# Patient Record
Sex: Female | Born: 1980 | Race: White | Hispanic: No | Marital: Married | Smoking: Never smoker
Health system: Southern US, Community
[De-identification: ages and names within clinical notes are randomized; demographics above are authoritative.]

## PROBLEM LIST (undated history)

## (undated) DIAGNOSIS — Z87442 Personal history of urinary calculi: Secondary | ICD-10-CM

## (undated) DIAGNOSIS — B019 Varicella without complication: Secondary | ICD-10-CM

## (undated) DIAGNOSIS — J302 Other seasonal allergic rhinitis: Secondary | ICD-10-CM

## (undated) DIAGNOSIS — J189 Pneumonia, unspecified organism: Secondary | ICD-10-CM

## (undated) DIAGNOSIS — D242 Benign neoplasm of left breast: Secondary | ICD-10-CM

## (undated) DIAGNOSIS — M1990 Unspecified osteoarthritis, unspecified site: Secondary | ICD-10-CM

## (undated) HISTORY — DX: Varicella without complication: B01.9

## (undated) HISTORY — PX: BREAST EXCISIONAL BIOPSY: SUR124

## (undated) HISTORY — PX: DILATION AND CURETTAGE OF UTERUS: SHX78

## (undated) HISTORY — DX: Other seasonal allergic rhinitis: J30.2

---

## 1998-03-27 HISTORY — PX: WISDOM TOOTH EXTRACTION: SHX21

## 2001-03-27 HISTORY — PX: BREAST BIOPSY: SHX20

## 2010-01-11 ENCOUNTER — Other Ambulatory Visit: Payer: Self-pay | Admitting: Physician Assistant

## 2014-03-27 ENCOUNTER — Emergency Department: Payer: Self-pay | Admitting: Emergency Medicine

## 2014-04-06 ENCOUNTER — Emergency Department: Payer: Self-pay | Admitting: Emergency Medicine

## 2014-11-25 ENCOUNTER — Encounter: Payer: Self-pay | Admitting: Family Medicine

## 2014-11-25 ENCOUNTER — Ambulatory Visit (INDEPENDENT_AMBULATORY_CARE_PROVIDER_SITE_OTHER)
Admission: RE | Admit: 2014-11-25 | Discharge: 2014-11-25 | Disposition: A | Discharge status: Home / Self Care | Payer: 59 | Source: Ambulatory Visit | Attending: Family Medicine | Admitting: Family Medicine

## 2014-11-25 ENCOUNTER — Other Ambulatory Visit (HOSPITAL_COMMUNITY)
Admission: RE | Admit: 2014-11-25 | Discharge: 2014-11-25 | Disposition: A | Discharge status: Home / Self Care | Payer: 59 | Source: Ambulatory Visit | Attending: Family Medicine | Admitting: Family Medicine

## 2014-11-25 ENCOUNTER — Encounter (INDEPENDENT_AMBULATORY_CARE_PROVIDER_SITE_OTHER): Payer: Self-pay

## 2014-11-25 ENCOUNTER — Ambulatory Visit (INDEPENDENT_AMBULATORY_CARE_PROVIDER_SITE_OTHER): Payer: 59 | Admitting: Family Medicine

## 2014-11-25 VITALS — BP 102/70 | HR 88 | Temp 99.2°F | Ht 66.25 in | Wt 153.1 lb

## 2014-11-25 DIAGNOSIS — Z3169 Encounter for other general counseling and advice on procreation: Secondary | ICD-10-CM | POA: Insufficient documentation

## 2014-11-25 DIAGNOSIS — J189 Pneumonia, unspecified organism: Secondary | ICD-10-CM | POA: Diagnosis not present

## 2014-11-25 DIAGNOSIS — R059 Cough, unspecified: Secondary | ICD-10-CM

## 2014-11-25 DIAGNOSIS — R05 Cough: Secondary | ICD-10-CM

## 2014-11-25 DIAGNOSIS — Z124 Encounter for screening for malignant neoplasm of cervix: Secondary | ICD-10-CM | POA: Diagnosis not present

## 2014-11-25 DIAGNOSIS — N898 Other specified noninflammatory disorders of vagina: Secondary | ICD-10-CM | POA: Diagnosis not present

## 2014-11-25 DIAGNOSIS — Z01419 Encounter for gynecological examination (general) (routine) without abnormal findings: Secondary | ICD-10-CM | POA: Insufficient documentation

## 2014-11-25 DIAGNOSIS — Z0000 Encounter for general adult medical examination without abnormal findings: Secondary | ICD-10-CM

## 2014-11-25 DIAGNOSIS — Z113 Encounter for screening for infections with a predominantly sexual mode of transmission: Secondary | ICD-10-CM | POA: Insufficient documentation

## 2014-11-25 LAB — BORDETELLA, RAPID TEST: BORDETELLA, RAPID: NEGATIVE

## 2014-11-25 MED ORDER — FLUCONAZOLE 150 MG PO TABS: ORAL_TABLET | ORAL | Status: DC

## 2014-11-25 MED ORDER — DOXYCYCLINE HYCLATE 100 MG PO TABS: 100.0000 mg | ORAL_TABLET | Freq: Two times a day (BID) | ORAL | Status: DC

## 2014-11-25 MED ORDER — HYDROCODONE-HOMATROPINE 5-1.5 MG/5ML PO SYRP: 5.0000 mL | ORAL_SOLUTION | Freq: Three times a day (TID) | ORAL | Status: DC | PRN

## 2014-11-25 MED ORDER — AZITHROMYCIN 250 MG PO TABS: ORAL_TABLET | ORAL | Status: DC

## 2014-11-25 MED ORDER — BENZONATATE 100 MG PO CAPS: 100.0000 mg | ORAL_CAPSULE | Freq: Three times a day (TID) | ORAL | Status: DC | PRN

## 2014-11-25 NOTE — Assessment & Plan Note (Signed)
Wet prep obtained today. Given symptom at all she will treat empirically for yeast while awaiting wet prep.

## 2014-11-25 NOTE — Progress Notes (Signed)
Pre visit review using our clinic review tool, if applicable. No additional management support is needed unless otherwise documented below in the visit note. 

## 2014-11-25 NOTE — Progress Notes (Signed)
Subjective:  Patient ID: Jodi Wright, female    DOB: May 30, 1980  Age: 34 y.o. MRN: [id number]  CC: Cough/Respiratory infection  HPI Jodi Wright is a 34 y.o. female presents to the clinic today with complaints of cough, congestion, and persistent respiratory tract infection.  Patient is also had recent vaginal discharge.  1) Cough, Congestion, Recently diagnosed with respiratory infection  Patient reports she has been sick since the beginning of August.  She initially developed congestion and associated URI symptoms. She was evaluated a local urgent care and told she had a viral upper respiratory infection and was given an antibiotic to take if she failed to improve. She states she took a full course of Augmentin.  Despite antibiotics she's continued to have persistent symptoms: Nasal congestion, cough, associated chest discomfort, drainage, fever.  She has had little improvement with over-the-counter Mucinex.  No exacerbating factors.  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  She states that she continues to feel poorly and is concerned that her respiratory infection is worsening.  2) Vaginal discharge  Patient reports that this past Saturday she developed vaginal discharge and associated itching.  She reports that she's had some blood when she wipes the discharge.  She denies any associated odor.  She used over-the-counter Monistat given her symptoms and has had minimal improvement.  She states that she is married and has no concerns about STDs.  No reports of dysuria, urinary urgency, frequency.  No other associative symptoms noted.  PMH, Surgical Hx, Family Hx, Social History reviewed and updated as below. Past Medical History  Diagnosis Date  . Chicken pox     Past Surgical History  Procedure Laterality Date  . Breast biopsy  2003    Family History  Problem Relation Age of Onset  . Arthritis Mother     . Cancer Mother     colon  . Hyperlipidemia Mother   . Mental illness Mother   . Hyperlipidemia Father   . Stroke Father   . Hypertension Father   . Arthritis Maternal Aunt   . Cancer Maternal Aunt     colon  . Arthritis Maternal Grandmother   . Cancer Maternal Grandfather     lung  . Diabetes Paternal Grandmother   . Alcohol abuse Paternal Grandfather     Social History  Substance Use Topics  . Smoking status: Never Smoker   . Smokeless tobacco: Never Used  . Alcohol Use: 0.0 - 0.6 oz/week    0-1 Standard drinks or equivalent per week   Review of Systems  Constitutional: Positive for fever and chills.  HENT: Positive for sinus pressure and sore throat.   Eyes: Negative.   Respiratory: Positive for cough. Negative for shortness of breath.   Gastrointestinal: Positive for nausea. Negative for vomiting.  Genitourinary:       Patient has had some incontinence associated with cough.  Musculoskeletal: Negative.   Skin: Negative.   Neurological: Negative.   Psychiatric/Behavioral: Negative.    Objective:   Today's Vitals: BP 102/70 mmHg  Pulse 88  Temp(Src) 99.2 F (37.3 C) (Oral)  Ht 5' 6.25" (1.683 m)  Wt 153 lb 2 oz (69.457 kg)  BMI 24.52 kg/m2  SpO2 97%  LMP 11/04/2014  Physical Exam  Constitutional: She is oriented to person, place, and time. She appears well-developed and well-nourished. No distress.  HENT:  Head: Normocephalic and atraumatic.  Mouth/Throat: No oropharyngeal exudate.  Eyes: Left eye exhibits no  discharge.  Neck: Neck supple.  Cardiovascular: Normal rate and regular rhythm.   No murmur heard. Pulmonary/Chest: Effort normal and breath sounds normal. No respiratory distress. She has no wheezes. She has no rales.  Abdominal: Soft. She exhibits no distension. There is no tenderness. There is no rebound and no guarding.  Genitourinary:  Pelvic Exam: External: normal female genitalia without lesions or        Masses. Vagina: normal without  lesions or masses Cervix: Yellow/green discharge noted from the cervical Os. Pap smear: performed Wet prep obtained.    Lymphadenopathy:    She has no cervical adenopathy.  Neurological: She is alert and oriented to person, place, and time.  Skin: Skin is warm and dry.  Psychiatric: She has a normal mood and affect.    Assessment & Plan:   Problem List Items Addressed This Visit    CAP (community acquired pneumonia) - Primary    Given persistent symptoms particular cough, chest x-ray as well as rapid pertussis obtained today. Awaiting pertussis test. I reviewed the chest x-ray personally. It revealed findings consistent with early pneumonia of the left lingular lobe. Treating patient with azithromycin. Tessalon and Hycodan given for cough.      Relevant Medications   cetirizine (ZYRTEC) 10 MG tablet   benzonatate (TESSALON) 100 MG capsule   HYDROcodone-homatropine (HYCODAN) 5-1.5 MG/5ML syrup   fluconazole (DIFLUCAN) 150 MG tablet   azithromycin (ZITHROMAX) 250 MG tablet   Preventative health care    Pap smear obtained today.      Vaginal discharge    Wet prep obtained today. Given symptom at all she will treat empirically for yeast while awaiting wet prep.      Relevant Orders   WET PREP FOR [place], YEAST, CLUE    Other Visit Diagnoses    Cough        Relevant Orders    DG Chest 2 View (Completed)    Bordetella, Rapid test    Pap smear for cervical cancer screening        Relevant Orders    Cytology - PAP       Outpatient Encounter Prescriptions as of 11/25/2014  Medication Sig  . cetirizine (ZYRTEC) 10 MG tablet Take 10 mg by mouth daily.  Marland Kitchen ibuprofen (ADVIL,MOTRIN) 100 MG tablet Take 100 mg by mouth every 6 (six) hours as needed for fever.  Marland Kitchen azithromycin (ZITHROMAX) 250 MG tablet Take 2 tablets on Day 1; 1 tablet daily on days 2-5.  . benzonatate (TESSALON) 100 MG capsule Take 1 capsule (100 mg total) by mouth 3 (three) times daily as needed for cough.  .  fluconazole (DIFLUCAN) 150 MG tablet Take 1 tablet once. Repeat dosing in 72 hours.  Marland Kitchen HYDROcodone-homatropine (HYCODAN) 5-1.5 MG/5ML syrup Take 5 mLs by mouth every 8 (eight) hours as needed for cough.  . [DISCONTINUED] doxycycline (VIBRA-TABS) 100 MG tablet Take 1 tablet (100 mg total) by mouth 2 (two) times daily.   No facility-administered encounter medications on file as of 11/25/2014.    Follow-up: PRN.   Coral Spikes DO

## 2014-11-25 NOTE — Assessment & Plan Note (Addendum)
Given persistent symptoms particularly fever & cough, chest x-ray as well as rapid pertussis obtained today. Awaiting pertussis test. I reviewed the chest x-ray personally. It revealed findings consistent with early pneumonia of the left lingular lobe. Treating patient with azithromycin. Tessalon and Hycodan given for cough.

## 2014-11-25 NOTE — Assessment & Plan Note (Signed)
Pap smear obtained today. 

## 2014-11-25 NOTE — Patient Instructions (Addendum)
It was nice to see you today.  Take the antibiotic as prescribed.  Use the hycodan at night and the tessalon during the day (for cough).  Be sure to get your chest xray.  Use the diflucan as directed.  If it persists you can refill it.  We will call with the results of your pap smear.   Follow up annually or sooner if needed.   Take care  Dr. Lacinda Axon

## 2014-11-26 LAB — CYTOLOGY - PAP

## 2014-11-27 ENCOUNTER — Other Ambulatory Visit: Payer: Self-pay | Admitting: Family Medicine

## 2014-11-27 LAB — WET PREP BY MOLECULAR PROBE
Candida species: POSITIVE — AB
GARDNERELLA VAGINALIS: POSITIVE — AB
Trichomonas vaginosis: NEGATIVE

## 2014-11-27 MED ORDER — METRONIDAZOLE 500 MG PO TABS: 500.0000 mg | ORAL_TABLET | Freq: Two times a day (BID) | ORAL | Status: DC

## 2014-12-01 ENCOUNTER — Encounter: Payer: Self-pay | Admitting: Family Medicine

## 2014-12-01 ENCOUNTER — Other Ambulatory Visit: Payer: Self-pay | Admitting: Family Medicine

## 2014-12-01 MED ORDER — ALBUTEROL SULFATE HFA 108 (90 BASE) MCG/ACT IN AERS: 2.0000 | INHALATION_SPRAY | Freq: Four times a day (QID) | RESPIRATORY_TRACT | Status: DC | PRN

## 2014-12-04 ENCOUNTER — Encounter: Payer: Self-pay | Admitting: Family Medicine

## 2014-12-09 ENCOUNTER — Encounter: Payer: Self-pay | Admitting: Family Medicine

## 2014-12-09 ENCOUNTER — Other Ambulatory Visit: Payer: Self-pay | Admitting: Family Medicine

## 2014-12-09 ENCOUNTER — Ambulatory Visit (INDEPENDENT_AMBULATORY_CARE_PROVIDER_SITE_OTHER): Payer: 59 | Admitting: Family Medicine

## 2014-12-09 ENCOUNTER — Ambulatory Visit (INDEPENDENT_AMBULATORY_CARE_PROVIDER_SITE_OTHER)
Admission: RE | Admit: 2014-12-09 | Discharge: 2014-12-09 | Disposition: A | Discharge status: Home / Self Care | Payer: 59 | Source: Ambulatory Visit | Attending: Family Medicine | Admitting: Family Medicine

## 2014-12-09 VITALS — BP 124/76 | HR 92 | Temp 98.8°F | Ht 66.25 in | Wt 156.5 lb

## 2014-12-09 DIAGNOSIS — R059 Cough, unspecified: Secondary | ICD-10-CM

## 2014-12-09 DIAGNOSIS — R05 Cough: Secondary | ICD-10-CM

## 2014-12-09 MED ORDER — CEFTRIAXONE SODIUM 1 G IJ SOLR
1.0000 g | Freq: Once | INTRAMUSCULAR | Status: AC
  Administered 2014-12-09: 1 g via INTRAMUSCULAR

## 2014-12-09 MED ORDER — LIDOCAINE HCL (PF) 1 % IJ SOLN: 2.0000 mL | Freq: Once | INTRAMUSCULAR | Status: DC

## 2014-12-09 MED ORDER — TRAMADOL HCL 50 MG PO TABS: 50.0000 mg | ORAL_TABLET | Freq: Three times a day (TID) | ORAL | Status: DC | PRN

## 2014-12-09 MED ORDER — DOXYCYCLINE HYCLATE 100 MG PO TABS: 100.0000 mg | ORAL_TABLET | Freq: Two times a day (BID) | ORAL | Status: DC

## 2014-12-09 NOTE — Assessment & Plan Note (Signed)
Patient with continued cough and associated chest/rib pain following recent bout of CAP. Repeating chest x-ray today. Empiric treatment with IM Rocephin (given in clinic today). Tramadol for pain. Recommended use of hycodan for cough.

## 2014-12-09 NOTE — Progress Notes (Signed)
Subjective:  Patient ID: Jodi Wright, female    DOB: 1981-02-22  Age: 34 y.o. MRN: [id number]  CC: Cough  HPI:  34 year old female presents to the clinic with complaints of continued cough.   Patient was seen on 8/31. She was diagnosed with ischemic or pneumonia at that time. Pertussis PCR was negative. She was started on azithromycin and given Hycodan for cough.    Patient presents today with complaints of continued cough and associated left upper chest pain/rib pain. She reports her cough remains productive. She states that she has had some improvement but it recently worsened. She states that the pain is severe and unrelenting. Pain interferes with sleep. She denies any associated fever, chills. She does report associated nasal congestion. She's been taking ibuprofen with some relief in her pain. Chest pain exacerbated by cough. She has not taken the hycodan given the fact that she's continued to work and medication has known side effect of drowsiness.  Social Hx   Social History   Social History  . Marital Status: Married    Spouse Name: N/A  . Number of Children: N/A  . Years of Education: N/A   Social History Main Topics  . Smoking status: Never Smoker   . Smokeless tobacco: Never Used  . Alcohol Use: 0.0 - 0.6 oz/week    0-1 Standard drinks or equivalent per week  . Drug Use: No  . Sexual Activity:    Partners: Male   Other Topics Concern  . None   Social History Narrative   Review of Systems  Constitutional: Negative for fever and chills.  HENT: Positive for congestion and sore throat.   Respiratory: Positive for cough and chest tightness.    Objective:  BP 124/76 mmHg  Pulse 92  Temp(Src) 98.8 F (37.1 C) (Oral)  Ht 5' 6.25" (1.683 m)  Wt 156 lb 8 oz (70.988 kg)  BMI 25.06 kg/m2  SpO2 94%  LMP 11/30/2014  BP/Weight 12/09/2014 7/40/8144  Systolic BP 818 563  Diastolic BP 76 70  Wt. (Lbs) 156.5 153.13  BMI 25.06 24.52   Physical Exam    Constitutional: She is oriented to person, place, and time. She appears well-developed and well-nourished.  Appears fatigued.  HENT:  Head: Normocephalic and atraumatic.  Mouth/Throat: No oropharyngeal exudate.  Normal TM's bilaterally.  Cardiovascular: Normal rate and regular rhythm.   No murmur heard. Pulmonary/Chest: Effort normal and breath sounds normal. No respiratory distress.  No appreciable wheezing or rales.  Coughing during exam.  Neurological: She is alert and oriented to person, place, and time.  Psychiatric:  Tearful.   Assessment & Plan:   Problem List Items Addressed This Visit    Cough - Primary    Patient with continued cough and associated chest/rib pain following recent bout of CAP. Repeating chest x-ray today. Empiric treatment with IM Rocephin (given in clinic today). Tramadol for pain. Recommended use of hycodan for cough.       Relevant Medications   lidocaine (PF) (XYLOCAINE) 1 % injection 2 mL (Start on 12/09/2014  2:56 PM)   Other Relevant Orders   DG Chest 2 View      Meds ordered this encounter  Medications  . traMADol (ULTRAM) 50 MG tablet    Sig: Take 1 tablet (50 mg total) by mouth every 8 (eight) hours as needed.    Dispense:  60 tablet    Refill:  0  . lidocaine (PF) (XYLOCAINE) 1 % injection 2 mL  Sig:     Follow-up: PRN    Thersa Salt, DO

## 2014-12-09 NOTE — Patient Instructions (Signed)
Use the Tramadol for pain.  Use the cough syrup as needed.  We will call with your chest xray results.  Take care  Dr. Lacinda Axon

## 2014-12-09 NOTE — Addendum Note (Signed)
Addended by: Carmin Muskrat on: 12/09/2014 02:31 PM   Modules accepted: Orders

## 2014-12-09 NOTE — Progress Notes (Signed)
Pre visit review using our clinic review tool, if applicable. No additional management support is needed unless otherwise documented below in the visit note. 

## 2014-12-30 ENCOUNTER — Encounter: Payer: Self-pay | Admitting: Family Medicine

## 2015-05-05 ENCOUNTER — Ambulatory Visit (INDEPENDENT_AMBULATORY_CARE_PROVIDER_SITE_OTHER): Payer: 59 | Admitting: Family Medicine

## 2015-05-05 ENCOUNTER — Encounter: Payer: Self-pay | Admitting: Family Medicine

## 2015-05-05 VITALS — BP 108/64 | HR 89 | Temp 98.9°F | Ht 66.25 in | Wt 161.0 lb

## 2015-05-05 DIAGNOSIS — F39 Unspecified mood [affective] disorder: Secondary | ICD-10-CM | POA: Diagnosis not present

## 2015-05-05 DIAGNOSIS — Z13220 Encounter for screening for lipoid disorders: Secondary | ICD-10-CM

## 2015-05-05 DIAGNOSIS — R5383 Other fatigue: Secondary | ICD-10-CM | POA: Diagnosis not present

## 2015-05-05 DIAGNOSIS — R4586 Emotional lability: Secondary | ICD-10-CM

## 2015-05-05 LAB — COMPREHENSIVE METABOLIC PANEL
ALBUMIN: 4.3 g/dL (ref 3.5–5.2)
ALK PHOS: 44 U/L (ref 39–117)
ALT: 20 U/L (ref 0–35)
AST: 15 U/L (ref 0–37)
BUN: 13 mg/dL (ref 6–23)
CO2: 28 mEq/L (ref 19–32)
Calcium: 9.6 mg/dL (ref 8.4–10.5)
Chloride: 104 mEq/L (ref 96–112)
Creatinine, Ser: 0.87 mg/dL (ref 0.40–1.20)
GFR: 77.85 mL/min (ref >60.00)
Glucose, Bld: 86 mg/dL (ref 70–99)
POTASSIUM: 3.7 meq/L (ref 3.5–5.1)
SODIUM: 139 meq/L (ref 135–145)
TOTAL PROTEIN: 6.6 g/dL (ref 6.0–8.3)
Total Bilirubin: 0.4 mg/dL (ref 0.2–1.2)

## 2015-05-05 LAB — CBC
HEMATOCRIT: 39.4 % (ref 36.0–46.0)
HEMOGLOBIN: 13.2 g/dL (ref 12.0–15.0)
MCHC: 33.5 g/dL (ref 30.0–36.0)
MCV: 86 fl (ref 78.0–100.0)
Platelets: 239 10*3/uL (ref 150.0–400.0)
RBC: 4.58 Mil/uL (ref 3.87–5.11)
RDW: 13.4 % (ref 11.5–15.5)
WBC: 8.4 10*3/uL (ref 4.0–10.5)

## 2015-05-05 LAB — TSH: TSH: 1.65 u[IU]/mL (ref 0.35–4.50)

## 2015-05-05 LAB — LIPID PANEL
Cholesterol: 186 mg/dL (ref 0–200)
HDL: 56.6 mg/dL (ref >39.00)
LDL Cholesterol: 102 mg/dL — ABNORMAL HIGH (ref 0–99)
NonHDL: 129
Total CHOL/HDL Ratio: 3
Triglycerides: 136 mg/dL (ref 0.0–149.0)
VLDL: 27.2 mg/dL (ref 0.0–40.0)

## 2015-05-05 LAB — VITAMIN B12: VITAMIN B 12: 392 pg/mL (ref 211–911)

## 2015-05-05 LAB — POCT URINE PREGNANCY: Preg Test, Ur: NEGATIVE

## 2015-05-05 NOTE — Patient Instructions (Signed)
We will call with your lab work.  Your exam was normal today.  I'm not sure of the cause of your symptoms.  Follow up if you fail to improve or worsen.  Take care  Dr. Lacinda Axon

## 2015-05-05 NOTE — Progress Notes (Signed)
   Subjective:  Patient ID: Jodi Wright, female    DOB: [birth year]  Age: 35 y.o. MRN: [id number]  CC: Moody, breast tenderness, skin breaking out, weight gain  HPI:  35 year old female presents to clinic today with the above complaints.  Patient states that over the past 1-1.5 months she has not felt like herself. She been expansion fatigue, moodiness, skin breaking out, weight gain. Last menstrual cycle was on January 15. No known inciting factor. No recent stressors at home or at work. She has not changed her diet recently. She does not exercise regularly. She is unclear what is going on. No other associated symptoms. No other complaints. No known exacerbating or relieving factors.  Social Hx   Social History   Social History  . Marital Status: Married    Spouse Name: N/A  . Number of Children: N/A  . Years of Education: N/A   Social History Main Topics  . Smoking status: Never Smoker   . Smokeless tobacco: Never Used  . Alcohol Use: 0.0 - 0.6 oz/week    0-1 Standard drinks or equivalent per week  . Drug Use: No  . Sexual Activity:    Partners: Male   Other Topics Concern  . None   Social History Narrative   Review of Systems  Constitutional: Positive for fatigue.       Weight gain.  Genitourinary:       Breast pain.  Skin:       Facial skin breaking out.  Psychiatric/Behavioral:       Moody/inpatient.   Objective:  BP 108/64 mmHg  Pulse 89  Temp(Src) 98.9 F (37.2 C) (Oral)  Ht 5' 6.25" (1.683 m)  Wt 161 lb (73.029 kg)  BMI 25.78 kg/m2  SpO2 98%  BP/Weight 05/05/2015 12/09/2014 2/77/4128  Systolic BP 786 767 209  Diastolic BP 64 76 70  Wt. (Lbs) 161 156.5 153.13  BMI 25.78 25.06 24.52   Physical Exam  Constitutional: She is oriented to person, place, and time. She appears well-developed. No distress.  Cardiovascular: Normal rate and regular rhythm.   Pulmonary/Chest: Effort normal and breath sounds normal.  Neurological: She is alert and oriented to  person, place, and time.  Psychiatric:  Anxious.   Vitals reviewed. Breasts: breasts appear normal, no appreciable masses, no skin or nipple changes or axillary nodes. Scar noted in the left axillar from prior surgery.   Assessment & Plan:   Problem List Items Addressed This Visit    Other fatigue   Relevant Orders   CBC   Comp Met (CMET)   TSH   B12   Mood changes ([place]) - Primary    New problem. Patient with mood issues/irritability as well as breast tenderness, facial breakouts, fatigue. Patient has was negative today. Symptoms appear to be consistent with PMS or PMDD (although she has not history of this). Reassurance provided. Obtaining labs.      Relevant Orders   POCT urine pregnancy (Completed)    Other Visit Diagnoses    Screening, lipid        Relevant Orders    Lipid Profile      Follow-up: PRN  [place]

## 2015-05-05 NOTE — Assessment & Plan Note (Signed)
New problem. Patient with mood issues/irritability as well as breast tenderness, facial breakouts, fatigue. Patient has was negative today. Symptoms appear to be consistent with PMS or PMDD (although she has not history of this). Reassurance provided. Obtaining labs.

## 2015-09-13 ENCOUNTER — Telehealth: Payer: Self-pay | Admitting: Family Medicine

## 2015-09-13 NOTE — Telephone Encounter (Signed)
[facility]  Patient Name: Jodi Wright  DOB: July 12, 1980    Initial Comment Caller states she has swollen finger, sore and bruised after getting jammed against football   Nurse Assessment  Nurse: Wayne Sever, RN, Tillie Rung Date/Time (Eastern Time): 09/13/2015 11:39:14 AM  Confirm and document reason for call. If symptomatic, describe symptoms. You must click the next button to save text entered. ---Caller states she was playing in the pool yesterday with family and a ball hit the left ring finger. She states the finger is pretty swollen and painful. She states it's swollen at the bottom of the finger. She states it's not hurting as bad today as it was last night.  Has the patient traveled out of the country within the last 30 days? ---Not Applicable  Does the patient have any new or worsening symptoms? ---Yes  Will a triage be completed? ---Yes  Related visit to physician within the last 2 weeks? ---No  Does the PT have any chronic conditions? (i.e. diabetes, asthma, etc.) ---No  Is the patient pregnant or possibly pregnant? (Ask all females between the ages of 27-55) ---No  Is this a behavioral health or substance abuse call? ---No     Guidelines    Guideline Title Affirmed Question Affirmed Notes  Finger Injury Large swelling or bruise    Final Disposition User   See Physician within 24 Hours Ford Cliff, RN, Tillie Rung    Comments  Scheduled at [phone] with Dr Glori Bickers on 06/20. She requested to be scheduled at [place] [facility]   Disagree/Comply: Comply

## 2015-09-14 ENCOUNTER — Ambulatory Visit (INDEPENDENT_AMBULATORY_CARE_PROVIDER_SITE_OTHER)
Admission: RE | Admit: 2015-09-14 | Discharge: 2015-09-14 | Disposition: A | Discharge status: Home / Self Care | Payer: Managed Care, Other (non HMO) | Source: Ambulatory Visit | Attending: Family Medicine | Admitting: Family Medicine

## 2015-09-14 ENCOUNTER — Encounter: Payer: Self-pay | Admitting: Family Medicine

## 2015-09-14 ENCOUNTER — Ambulatory Visit (INDEPENDENT_AMBULATORY_CARE_PROVIDER_SITE_OTHER): Payer: Managed Care, Other (non HMO) | Admitting: Family Medicine

## 2015-09-14 VITALS — BP 102/64 | HR 69 | Temp 99.0°F | Ht 66.25 in | Wt 156.5 lb

## 2015-09-14 DIAGNOSIS — S6992XA Unspecified injury of left wrist, hand and finger(s), initial encounter: Secondary | ICD-10-CM

## 2015-09-14 DIAGNOSIS — S6990XA Unspecified injury of unspecified wrist, hand and finger(s), initial encounter: Secondary | ICD-10-CM | POA: Insufficient documentation

## 2015-09-14 NOTE — Patient Instructions (Signed)
I think you have a contused finger but want to rule out a fracture  Wear the splint to protect it when needed (wrap in guaze and adjust as needed)  Start using ice/ cold compress- 10 minutes at a time whenever you get a chance Xray now - we will get back to you with results I recommend 2 ibuprofen with food three times per day while this heals  If no improvement in a week please let us know

## 2015-09-14 NOTE — Progress Notes (Signed)
Subjective:    Patient ID: Jodi Wright, female    DOB: 07-19-1980, 35 y.o.   MRN: [id number]  HPI Review of Systems    Here with a finger injury (sunday) A ball hit her hand in the pool  (a heavy wet football)  Her ring finger on L hand was hit and bothersome  Both her ring finger and 3rd finger were forcibly flexed with the injury   Bruising started - later that evening  Swelling started within the hour  Had to take her rings off- it was painful   No throbbing  Only hurts to move it )especailly to put fingers together)  A little tender to the touch   Has taken some ibuprofen - just a few times  In the water (the coldness helped) No ice packs   Right now feels a little tight   Did not get a hand splint   ROS: Review of Systems  Constitutional: Negative for fever, appetite change, fatigue and unexpected weight change.  Eyes: Negative for pain and visual disturbance.  Respiratory: Negative for cough and shortness of breath.   Cardiovascular: Negative for cp or palpitations    Gastrointestinal: Negative for nausea, diarrhea and constipation.  Genitourinary: Negative for urgency and frequency.  Skin: Negative for pallor or rash   MSK pos for finer pain  Neurological: Negative for weakness, light-headedness, numbness and headaches.  Hematological: Negative for adenopathy. Does not bruise/bleed easily.  Psychiatric/Behavioral: Negative for dysphoric mood. The patient is not nervous/anxious.       Patient Active Problem List   Diagnosis Date Noted  . Finger injury 09/14/2015  . Mood changes ([place]) 05/05/2015  . Other fatigue 05/05/2015  . Preventative health care 11/25/2014   Past Medical History  Diagnosis Date  . Chicken pox    Past Surgical History  Procedure Laterality Date  . Breast biopsy  2003   Social History  Substance Use Topics  . Smoking status: Never Smoker   . Smokeless tobacco: Never Used  . Alcohol Use: 0.0 - 0.6 oz/week    0-1 Standard  drinks or equivalent per week     Comment: occ   Family History  Problem Relation Age of Onset  . Arthritis Mother   . Cancer Mother     colon  . Hyperlipidemia Mother   . Mental illness Mother   . Hyperlipidemia Father   . Stroke Father   . Hypertension Father   . Arthritis Maternal Aunt   . Cancer Maternal Aunt     colon  . Arthritis Maternal Grandmother   . Cancer Maternal Grandfather     lung  . Diabetes Paternal Grandmother   . Alcohol abuse Paternal Grandfather    Allergies  Allergen Reactions  . Sulfa Antibiotics Nausea And Vomiting  . Ciprofloxacin Rash  . Mucinex Dm [Dm-Guaifenesin Er] Hypertension    Felt dizzy   No current outpatient prescriptions on file prior to visit.   No current facility-administered medications on file prior to visit.     Objective:   Physical Exam  Constitutional: She appears well-developed and well-nourished. No distress.  Well appearing   Eyes: Conjunctivae and EOM are normal. Pupils are equal, round, and reactive to light.  Neck: Normal range of motion. Neck supple.  Cardiovascular: Normal rate and regular rhythm.   Musculoskeletal: She exhibits edema and tenderness.  L hand  4th finger- swollen proximally with ecchymosis on palmar side  Nl rom with pain on full flexion  Nl sens  and perfusion    Lymphadenopathy:    She has no cervical adenopathy.  Neurological: She is alert. She has normal strength and normal reflexes. She displays no atrophy. No sensory deficit. She exhibits normal muscle tone.  Skin: Skin is warm and dry. No erythema.  Psychiatric: She has a normal mood and affect.          Assessment & Plan:   Problem List Items Addressed This Visit      Other   Finger injury - Primary    L ring finger was jammed by a football Is bruised and mildly swollen on palmar side  Nl rom with discomfort Xray today Given baseball splint to protect Recommend ice/ and nsaid prn otc Pending report for further advisement        Relevant Orders   DG Finger Ring Left (Completed)

## 2015-09-14 NOTE — Assessment & Plan Note (Signed)
L ring finger was jammed by a football Is bruised and mildly swollen on palmar side  Nl rom with discomfort Xray today Given baseball splint to protect Recommend ice/ and nsaid prn otc Pending report for further advisement

## 2016-02-01 ENCOUNTER — Other Ambulatory Visit: Payer: Self-pay | Admitting: Family Medicine

## 2016-04-25 ENCOUNTER — Encounter: Payer: Self-pay | Admitting: Family Medicine

## 2016-04-25 ENCOUNTER — Ambulatory Visit (INDEPENDENT_AMBULATORY_CARE_PROVIDER_SITE_OTHER): Payer: Managed Care, Other (non HMO) | Admitting: Family Medicine

## 2016-04-25 DIAGNOSIS — H6690 Otitis media, unspecified, unspecified ear: Secondary | ICD-10-CM | POA: Insufficient documentation

## 2016-04-25 DIAGNOSIS — H65192 Other acute nonsuppurative otitis media, left ear: Secondary | ICD-10-CM | POA: Diagnosis not present

## 2016-04-25 MED ORDER — AMOXICILLIN-POT CLAVULANATE 875-125 MG PO TABS: 1.0000 | ORAL_TABLET | Freq: Two times a day (BID) | ORAL | 0 refills | Status: DC

## 2016-04-25 NOTE — Patient Instructions (Signed)
Take the antibiotic as prescribed.  Ibuprofen 800 mg three times daily as needed.  Take care  Dr. Lacinda Axon    Otitis Media, Adult Otitis media is redness, soreness, and inflammation of the middle ear. Otitis media may be caused by allergies or, most commonly, by infection. Often it occurs as a complication of the common cold. What are the signs or symptoms? Symptoms of otitis media may include:  Earache.  Fever.  Ringing in your ear.  Headache.  Leakage of fluid from the ear. How is this diagnosed? To diagnose otitis media, your health care provider will examine your ear with an otoscope. This is an instrument that allows your health care provider to see into your ear in order to examine your eardrum. Your health care provider also will ask you questions about your symptoms. How is this treated? Typically, otitis media resolves on its own within 3-5 days. Your health care provider may prescribe medicine to ease your symptoms of pain. If otitis media does not resolve within 5 days or is recurrent, your health care provider may prescribe antibiotic medicines if he or she suspects that a bacterial infection is the cause. Follow these instructions at home:  If you were prescribed an antibiotic medicine, finish it all even if you start to feel better.  Take medicines only as directed by your health care provider.  Keep all follow-up visits as directed by your health care provider. Contact a health care provider if:  You have otitis media only in one ear, or bleeding from your nose, or both.  You notice a lump on your neck.  You are not getting better in 3-5 days.  You feel worse instead of better. Get help right away if:  You have pain that is not controlled with medicine.  You have swelling, redness, or pain around your ear or stiffness in your neck.  You notice that part of your face is paralyzed.  You notice that the bone behind your ear (mastoid) is tender when you touch  it. This information is not intended to replace advice given to you by your health care provider. Make sure you discuss any questions you have with your health care provider. Document Released: 12/17/2003 Document Revised: 08/19/2015 Document Reviewed: 10/08/2012 Elsevier Interactive Patient Education  2017 [employer].

## 2016-04-25 NOTE — Progress Notes (Signed)
   Subjective:  Patient ID: Jodi Wright, female    DOB: [birth year]  Age: 36 y.o. MRN: [id number]  CC: Ear pain  HPI:  36 year old female presents with complaints of left ear pain.  Left ear pain  Started yesterday.  Severe.  She's had ongoing sinus congestion.  No associated fever.  She's been using ibuprofen with some improvement.  No known exacerbating factors.  No other associated symptoms. No other complaints at this time.  Social Hx   Social History   Social History  . Marital status: Married    Spouse name: N/A  . Number of children: N/A  . Years of education: N/A   Social History Main Topics  . Smoking status: Never Smoker  . Smokeless tobacco: Never Used  . Alcohol use 0.0 - 0.6 oz/week     Comment: occ  . Drug use: No  . Sexual activity: Yes    Partners: Male   Other Topics Concern  . None   Social History Narrative  . None    Review of Systems  Constitutional: Negative for fever.  HENT: Positive for congestion and ear pain.    Objective:  BP 140/86   Pulse 95   Temp 99.6 F (37.6 C) (Oral)   Wt 153 lb 9.6 oz (69.7 kg)   SpO2 100%   BMI 24.60 kg/m   BP/Weight 04/25/2016 [id number]  Systolic BP [id number]  Diastolic BP 86 64 64  Wt. (Lbs) 153.6 156.5 161  BMI 24.6 25.06 25.78   Physical Exam  Constitutional: She is oriented to person, place, and time. She appears well-developed. No distress.  HENT:  Left ear - severe erythema. + Effusion. Small bullae noted.  Pulmonary/Chest: Effort normal.  Neurological: She is alert and oriented to person, place, and time.  Psychiatric: She has a normal mood and affect.  Vitals reviewed.  Lab Results  Component Value Date   WBC 8.4 05/05/2015   HGB 13.2 05/05/2015   HCT 39.4 05/05/2015   PLT 239.0 05/05/2015   GLUCOSE 86 05/05/2015   CHOL 186 05/05/2015   TRIG 136.0 05/05/2015   HDL 56.60 05/05/2015   LDLCALC 102 (H) 05/05/2015   ALT 20 05/05/2015   AST 15 05/05/2015   NA  139 05/05/2015   K 3.7 05/05/2015   CL 104 05/05/2015   CREATININE 0.87 05/05/2015   BUN 13 05/05/2015   CO2 28 05/05/2015   TSH 1.65 05/05/2015    Assessment & Plan:   Problem List Items Addressed This Visit    Acute otitis media    New problem. Treating with Augmentin & PRN Motrin.      Relevant Medications   amoxicillin-clavulanate (AUGMENTIN) 875-125 MG tablet     Meds ordered this encounter  Medications  . cetirizine (ZYRTEC) 10 MG tablet    Sig: Take 10 mg by mouth daily.  Marland Kitchen amoxicillin-clavulanate (AUGMENTIN) 875-125 MG tablet    Sig: Take 1 tablet by mouth 2 (two) times daily.    Dispense:  20 tablet    Refill:  0    Follow-up: PRN  [place]

## 2016-04-25 NOTE — Assessment & Plan Note (Signed)
New problem. Treating with Augmentin & PRN Motrin.

## 2016-05-02 ENCOUNTER — Encounter: Payer: Self-pay | Admitting: Family Medicine

## 2016-05-10 ENCOUNTER — Ambulatory Visit (INDEPENDENT_AMBULATORY_CARE_PROVIDER_SITE_OTHER): Payer: Managed Care, Other (non HMO) | Admitting: Family Medicine

## 2016-05-10 ENCOUNTER — Encounter: Payer: Self-pay | Admitting: Family Medicine

## 2016-05-10 DIAGNOSIS — H9192 Unspecified hearing loss, left ear: Secondary | ICD-10-CM | POA: Diagnosis not present

## 2016-05-10 DIAGNOSIS — H9190 Unspecified hearing loss, unspecified ear: Secondary | ICD-10-CM | POA: Insufficient documentation

## 2016-05-10 NOTE — Patient Instructions (Signed)
Sudafed regularly.  We will arrange your referral.  Take care  Dr. Lacinda Axon

## 2016-05-10 NOTE — Progress Notes (Signed)
   Subjective:  Patient ID: Jodi Wright, female    DOB: [birth year]  Age: 36 y.o. MRN: [id number]  CC: Hearing loss, ear "clogged"  HPI:  36 year old female presents with the above complaint.  Patient was seen on 1/30 and was found to have acute otitis media. She was treated with Augmentin. She reports that she had improvement in pain but continues to have difficulty hearing. She also reports some hyperacusis. She reports that her ear feels "clogged". No reports of pain currently. No known exacerbating or relieving factors. Her symptoms are quite worrisome for her. No other associated symptoms. No other complaints at this time.  Social Hx   Social History   Social History  . Marital status: Married    Spouse name: N/A  . Number of children: N/A  . Years of education: N/A   Social History Main Topics  . Smoking status: Never Smoker  . Smokeless tobacco: Never Used  . Alcohol use 0.0 - 0.6 oz/week     Comment: occ  . Drug use: No  . Sexual activity: Yes    Partners: Male   Other Topics Concern  . None   Social History Narrative  . None    Review of Systems  Constitutional: Negative.   HENT: Positive for hearing loss.    Objective:  BP 119/82 (BP Location: Left Arm, Patient Position: Sitting, Cuff Size: Normal)   Pulse 83   Temp 98.7 F (37.1 C) (Oral)   Wt 152 lb 6.4 oz (69.1 kg)   SpO2 98%   BMI 24.41 kg/m   BP/Weight 05/10/2016 04/25/2016 [id number]  Systolic BP 123456 [id number]  Diastolic BP 82 86 64  Wt. (Lbs) 152.4 153.6 156.5  BMI 24.41 24.6 25.06   Physical Exam  Constitutional: She is oriented to person, place, and time. She appears well-developed. No distress.  HENT:  Head: Normocephalic and atraumatic.  Left TM - No effusion; No erythema of bulging.  Cardiovascular: Normal rate and regular rhythm.   Neurological: She is alert and oriented to person, place, and time.  Psychiatric: She has a normal mood and affect.  Vitals reviewed.  Lab Results    Component Value Date   WBC 8.4 05/05/2015   HGB 13.2 05/05/2015   HCT 39.4 05/05/2015   PLT 239.0 05/05/2015   GLUCOSE 86 05/05/2015   CHOL 186 05/05/2015   TRIG 136.0 05/05/2015   HDL 56.60 05/05/2015   LDLCALC 102 (H) 05/05/2015   ALT 20 05/05/2015   AST 15 05/05/2015   NA 139 05/05/2015   K 3.7 05/05/2015   CL 104 05/05/2015   CREATININE 0.87 05/05/2015   BUN 13 05/05/2015   CO2 28 05/05/2015   TSH 1.65 05/05/2015    Assessment & Plan:   Problem List Items Addressed This Visit    Hearing loss    New problem. Following recent OM. No current effusion. Advised use of Sudafed. Referring to ENT for further evaluation.      Relevant Orders   Ambulatory referral to ENT     Follow-up: PRN  [place]

## 2016-05-10 NOTE — Assessment & Plan Note (Signed)
New problem. Following recent OM. No current effusion. Advised use of Sudafed. Referring to ENT for further evaluation.

## 2016-11-13 ENCOUNTER — Ambulatory Visit (INDEPENDENT_AMBULATORY_CARE_PROVIDER_SITE_OTHER): Payer: 59 | Admitting: Family

## 2016-11-13 VITALS — BP 126/68 | HR 79 | Temp 98.0°F | Resp 14 | Wt 149.0 lb

## 2016-11-13 DIAGNOSIS — M546 Pain in thoracic spine: Secondary | ICD-10-CM | POA: Diagnosis not present

## 2016-11-13 MED ORDER — MELOXICAM 7.5 MG PO TABS: 7.5000 mg | ORAL_TABLET | Freq: Every day | ORAL | 0 refills | Status: DC

## 2016-11-13 NOTE — Progress Notes (Signed)
Subjective:    Patient ID: Jodi Wright, female    DOB: 11/26/1980, 36 y.o.   MRN: [id number]  CC: Jodi Wright is a 36 y.o. female who presents today for an acute visit.    HPI: CC: mid back spasms x one week, improved  Pain in right mid back if laying down. Pain triggered by movement.  Tried pillows and seems to hurt. No injury, weakness, falls. Over the weekend, felt like couldn't take deep breath without pain, since has resolved.   Tried ibuprofen, heat with relief. Cannot lift children   Had similar episode 3 years ago , resolved on mobic. 'muscle relaxants didn't work.'  No dysuria, urinary frequency, N, vomiting, hematuria.       HISTORY:  Past Medical History:  Diagnosis Date  . Chicken pox    Past Surgical History:  Procedure Laterality Date  . BREAST BIOPSY  2003   Family History  Problem Relation Age of Onset  . Arthritis Mother   . Cancer Mother        colon  . Hyperlipidemia Mother   . Mental illness Mother   . Hyperlipidemia Father   . Stroke Father   . Hypertension Father   . Arthritis Maternal Aunt   . Cancer Maternal Aunt        colon  . Arthritis Maternal Grandmother   . Cancer Maternal Grandfather        lung  . Diabetes Paternal Grandmother   . Alcohol abuse Paternal Grandfather     Allergies: Sulfa antibiotics; Ciprofloxacin; and Mucinex dm [dm-guaifenesin er] Current Outpatient Prescriptions on File Prior to Visit  Medication Sig Dispense Refill  . cetirizine (ZYRTEC) 10 MG tablet Take 10 mg by mouth daily.    Marland Kitchen PROAIR HFA 108 (90 Base) MCG/ACT inhaler INHALE 2 PUFFS INTO THE LUNGS EVERY 6 (SIX) HOURS AS NEEDED FOR WHEEZING OR SHORTNESS OF BREATH. 8.5 Inhaler 0   No current facility-administered medications on file prior to visit.     Social History  Substance Use Topics  . Smoking status: Never Smoker  . Smokeless tobacco: Never Used  . Alcohol use 0.0 - 0.6 oz/week     Comment: occ    Review of Systems  Constitutional:  Negative for chills and fever.  Respiratory: Negative for cough.   Cardiovascular: Negative for chest pain and palpitations.  Gastrointestinal: Negative for abdominal pain, nausea and vomiting.  Genitourinary: Negative for difficulty urinating, dysuria and hematuria.  Musculoskeletal: Positive for back pain.  Neurological: Negative for numbness.      Objective:    BP 126/68 (BP Location: Right Arm, Patient Position: Sitting, Cuff Size: Normal)   Pulse 79   Temp 98 F (36.7 C) (Oral)   Resp 14   Wt 149 lb (67.6 kg)   LMP 11/06/2016   SpO2 99%   BMI 23.87 kg/m    Physical Exam  Constitutional: She appears well-developed and well-nourished.  Eyes: Conjunctivae are normal.  Cardiovascular: Normal rate, regular rhythm, normal heart sounds and normal pulses.   Pulmonary/Chest: Effort normal and breath sounds normal. She has no wheezes. She has no rhonchi. She has no rales.  No back or abdominal pain with deep inspiration.   Musculoskeletal:       Lumbar back: She exhibits normal range of motion, no tenderness, no bony tenderness, no swelling, no edema, no pain and no spasm.  Limited range of motion with flexion, tension, lateral side bends due to pain. No bony tenderness. No pain, numbness,  tingling elicited with single leg raise bilaterally.   Neurological: She is alert. She has normal strength. No sensory deficit.  Reflex Scores:      Patellar reflexes are 2+ on the right side and 2+ on the left side. Sensation and strength intact bilateral lower extremities.  Skin: Skin is warm and dry.  No rash  Psychiatric: She has a normal mood and affect. Her speech is normal and behavior is normal. Thought content normal.  Vitals reviewed.      Assessment & Plan:  1. Acute bilateral thoracic back pain Suspect muscle spasm, strain. Since mobic worked in past, we will start with mobic. Return precautions given.  - meloxicam (MOBIC) 7.5 MG tablet; Take 1 tablet (7.5 mg total) by mouth  daily.  Dispense: 30 tablet; Refill: 0     I am having Ms. Prats maintain her PROAIR HFA and cetirizine.   No orders of the defined types were placed in this encounter.   Return precautions given.   Risks, benefits, and alternatives of the medications and treatment plan prescribed today were discussed, and patient expressed understanding.   Education regarding symptom management and diagnosis given to patient on AVS.  Continue to follow with Burnard Hawthorne, FNP for routine health maintenance.   [name] and I agreed with plan.   Mable Paris, FNP

## 2016-11-13 NOTE — Progress Notes (Signed)
Pre-visit discussion using our clinic review tool. No additional management support is needed unless otherwise documented below in the visit note.  

## 2016-11-13 NOTE — Patient Instructions (Addendum)
Trial mobic once daily for back pain  Suspect musculoskeletal.   Also heat is key.    Please let me know if not better

## 2016-11-15 ENCOUNTER — Telehealth: Payer: Self-pay | Admitting: Family

## 2016-11-15 NOTE — Telephone Encounter (Signed)
Pt called and stated that she saw M. Arnett about back spasms. She states that she is starting to feel better yesterday but she states that it feels like it has moved down deeper into her body and it having pain in her lower left side. Please advise, thank you!  Call pt @ [phone]

## 2016-11-15 NOTE — Telephone Encounter (Signed)
Patient is having intermittent waves of nausea but no vomiting, denies fever but says she was having some chills last night. Was seen in the office on Monday for spasms in the mid back but the pain now is different. Pain has moved down to left lower back and says that is a constant deeper pain in the left lower back that started this morning. Please advise.

## 2016-11-15 NOTE — Telephone Encounter (Signed)
Noted! Thank you

## 2016-11-15 NOTE — Telephone Encounter (Signed)
this is concerning for kidney stone  Has she ever had a renal stone?  Please get more info.   We can order urine to look for blood however I would be more comfortable if she could see a provider prior to ordering a CT as not sure if we would need renal study alone OR CT abdomen and pelvis if she has other pain.

## 2016-11-15 NOTE — Telephone Encounter (Signed)
Patient states that she is not having any pain in the abdomen. Denies any urinary symptoms. Put patient on the schedule for 1:15pm tomorrow and advised patient to go to ED or urgent care if symptoms worsen before then.

## 2016-11-16 ENCOUNTER — Ambulatory Visit (INDEPENDENT_AMBULATORY_CARE_PROVIDER_SITE_OTHER): Payer: 59 | Admitting: Family

## 2016-11-16 ENCOUNTER — Ambulatory Visit
Admission: RE | Admit: 2016-11-16 | Discharge: 2016-11-16 | Disposition: A | Discharge status: Home / Self Care | Payer: 59 | Source: Ambulatory Visit | Attending: Family | Admitting: Family

## 2016-11-16 ENCOUNTER — Encounter: Payer: Self-pay | Admitting: Family

## 2016-11-16 VITALS — BP 126/78 | HR 98 | Temp 99.2°F | Ht 66.25 in | Wt 147.8 lb

## 2016-11-16 DIAGNOSIS — R109 Unspecified abdominal pain: Secondary | ICD-10-CM | POA: Insufficient documentation

## 2016-11-16 DIAGNOSIS — N200 Calculus of kidney: Secondary | ICD-10-CM | POA: Diagnosis not present

## 2016-11-16 LAB — POCT URINALYSIS DIPSTICK
BILIRUBIN UA: NEGATIVE
Glucose, UA: NEGATIVE
KETONES UA: NEGATIVE
Leukocytes, UA: NEGATIVE
NITRITE UA: NEGATIVE
PH UA: 5.5 (ref 5.0–8.0)
Protein, UA: NEGATIVE
Spec Grav, UA: 1.02 (ref 1.010–1.025)
Urobilinogen, UA: 0.2 E.U./dL

## 2016-11-16 LAB — POCT URINE PREGNANCY: Preg Test, Ur: NEGATIVE

## 2016-11-16 LAB — URINALYSIS, MICROSCOPIC ONLY
RBC / HPF: NONE SEEN (ref 0–?)
WBC, UA: NONE SEEN (ref 0–?)

## 2016-11-16 NOTE — Progress Notes (Signed)
Pre visit review using our clinic review tool, if applicable. No additional management support is needed unless otherwise documented below in the visit note. 

## 2016-11-16 NOTE — Patient Instructions (Signed)
Pending CT renal study. Please keep cell phone with you so I can call you with results.  Please stay very vigilant for any new, worsening symptoms.

## 2016-11-16 NOTE — Progress Notes (Signed)
Subjective:    Patient ID: Jodi Wright, female    DOB: 1981-02-19, 36 y.o.   MRN: [id number]  CC: Jodi Wright is a 36 y.o. female who presents today for an acute visit.    HPI: CC: left low back, flank pain x 3 days, unchanged. 'Pain feels different' Constant, worse when lifts left leg.  Endorses chills and intermittent nausea, diarrhea. 4 episodes of diarrhea, non bloody, loss appetite. Notes one episode of dysuria. Today one episode of diarrhea. Bright liquid.  No fever, abdominal pain, chest pain, urinary frequency.   Upper back pain resolved with mobic  LMP: 8/12-8/17  Currently trying to conceive  No h/o abdomina surgeries.     HISTORY:  Past Medical History:  Diagnosis Date  . Chicken pox    Past Surgical History:  Procedure Laterality Date  . BREAST BIOPSY  2003   Family History  Problem Relation Age of Onset  . Arthritis Mother   . Cancer Mother        colon  . Hyperlipidemia Mother   . Mental illness Mother   . Hyperlipidemia Father   . Stroke Father   . Hypertension Father   . Arthritis Maternal Aunt   . Cancer Maternal Aunt        colon  . Arthritis Maternal Grandmother   . Cancer Maternal Grandfather        lung  . Diabetes Paternal Grandmother   . Alcohol abuse Paternal Grandfather     Allergies: Sulfa antibiotics; Ciprofloxacin; and Mucinex dm [dm-guaifenesin er] Current Outpatient Prescriptions on File Prior to Visit  Medication Sig Dispense Refill  . cetirizine (ZYRTEC) 10 MG tablet Take 10 mg by mouth daily.    . meloxicam (MOBIC) 7.5 MG tablet Take 1 tablet (7.5 mg total) by mouth daily. 30 tablet 0  . PROAIR HFA 108 (90 Base) MCG/ACT inhaler INHALE 2 PUFFS INTO THE LUNGS EVERY 6 (SIX) HOURS AS NEEDED FOR WHEEZING OR SHORTNESS OF BREATH. 8.5 Inhaler 0   No current facility-administered medications on file prior to visit.     Social History  Substance Use Topics  . Smoking status: Never Smoker  . Smokeless tobacco: Never Used    . Alcohol use 0.0 - 0.6 oz/week     Comment: occ    Review of Systems  Constitutional: Positive for chills. Negative for fever.  Respiratory: Negative for cough.   Cardiovascular: Negative for chest pain and palpitations.  Gastrointestinal: Positive for diarrhea and nausea. Negative for abdominal pain, blood in stool, constipation and vomiting.  Genitourinary: Positive for dysuria and flank pain. Negative for frequency, vaginal bleeding, vaginal discharge and vaginal pain.      Objective:    BP 126/78   Pulse 98   Temp 99.2 F (37.3 C) (Oral)   Ht 5' 6.25" (1.683 m)   Wt 147 lb 12.8 oz (67 kg)   LMP 11/06/2016   SpO2 97%   BMI 23.68 kg/m    Physical Exam  Constitutional: She appears well-developed and well-nourished.  Eyes: Conjunctivae are normal.  Cardiovascular: Normal rate, regular rhythm, normal heart sounds and normal pulses.   Pulmonary/Chest: Effort normal and breath sounds normal. She has no wheezes. She has no rhonchi. She has no rales.  Abdominal: Soft. Normal appearance and bowel sounds are normal. She exhibits no distension, no fluid wave, no ascites and no mass. There is no tenderness. There is no rigidity, no rebound, no guarding, no CVA tenderness, no tenderness at McBurney's point and negative  Murphy's sign.  Musculoskeletal:       Lumbar back: She exhibits pain. She exhibits normal range of motion, no tenderness, no bony tenderness, no swelling and no spasm.       Back:  Area of pain as noted by patient. Pain increased when raised left thigh off exam table.   Neurological: She is alert.  Skin: Skin is warm and dry.  Psychiatric: She has a normal mood and affect. Her speech is normal and behavior is normal. Thought content normal.  Vitals reviewed.      Assessment & Plan:   1. Left flank pain Reassured as patient is afebrile and well-appearing today in office. POCT showed trace blood. Reassured by benign abdominal exam and the absence of any  abdominal pain. Urine hCG negative.  At this point differentials include renal stone,UTI, musculoskeletal etiology. Advised close vigilance to patient during active work up.    - POCT urinalysis dipstick - CULTURE, URINE COMPREHENSIVE - Urine Microscopic Only - POCT urine pregnancy - CT RENAL STONE STUDY    I am having Jodi Wright maintain her PROAIR HFA, cetirizine, and meloxicam.   No orders of the defined types were placed in this encounter.   Return precautions given.   Risks, benefits, and alternatives of the medications and treatment plan prescribed today were discussed, and patient expressed understanding.   Education regarding symptom management and diagnosis given to patient on AVS.  Continue to follow with Burnard Hawthorne, FNP for routine health maintenance.   [name] and I agreed with plan.   Mable Paris, FNP

## 2016-11-17 ENCOUNTER — Telehealth (HOSPITAL_COMMUNITY): Payer: Self-pay | Admitting: Family

## 2016-11-17 NOTE — Telephone Encounter (Signed)
Called pt to discuss results of CT renal Reviewed all details of CT. No obstructing renal stones on left to explain pain.  Pain hasn't worsened and 'very much' left low sided.   Pain is nonspecific at this point and advised HYPERvigilance with any new, worsening symptoms.  Patient will trial heat, mobic, and flexeril tonight ( called in).   As explained to patient, differentials at this time include MS etiology , UTI. Awaiting Urine culture.

## 2016-11-18 LAB — CULTURE, URINE COMPREHENSIVE

## 2016-12-10 ENCOUNTER — Other Ambulatory Visit: Payer: Self-pay | Admitting: Family

## 2016-12-10 DIAGNOSIS — M546 Pain in thoracic spine: Secondary | ICD-10-CM

## 2017-03-01 ENCOUNTER — Encounter: Payer: Self-pay | Admitting: Advanced Practice Midwife

## 2017-03-01 ENCOUNTER — Ambulatory Visit (INDEPENDENT_AMBULATORY_CARE_PROVIDER_SITE_OTHER): Payer: 59 | Admitting: Advanced Practice Midwife

## 2017-03-01 ENCOUNTER — Other Ambulatory Visit: Payer: Self-pay

## 2017-03-01 VITALS — BP 100/66 | HR 72 | Ht 67.0 in | Wt 148.0 lb

## 2017-03-01 DIAGNOSIS — Z3141 Encounter for fertility testing: Secondary | ICD-10-CM

## 2017-03-01 DIAGNOSIS — N979 Female infertility, unspecified: Secondary | ICD-10-CM

## 2017-03-01 DIAGNOSIS — Z01419 Encounter for gynecological examination (general) (routine) without abnormal findings: Secondary | ICD-10-CM

## 2017-03-01 MED ORDER — CLOMIPHENE CITRATE 50 MG PO TABS: 50.0000 mg | ORAL_TABLET | Freq: Every day | ORAL | 0 refills | Status: AC

## 2017-03-01 NOTE — Progress Notes (Signed)
Patient ID: Jodi Wright, female   DOB: 05-24-1980, 36 y.o.   MRN: [id number]     Gynecology Annual Exam  PCP: Burnard Hawthorne, FNP  Chief Complaint:  Chief Complaint  Patient presents with  . Gynecologic Exam    Conception Counseling    History of Present Illness: Patient is a 36 y.o. G2P2002 presents for annual exam. The patient has complaints today of attempting to conceive for the past year. She has a history of attempting for 19 months and then following a D&C able to conceive with her first child. Her second baby was conceived 5 years ago without difficulty. Discussion of various aspects of infertility and methods of increasing the likely hood including hormone testing, cervical mucous, basal body temperature, clomid, testing for possible uterine/tube causes, sperm count, acupuncture, Arvigo abdominal massage, reducing stress, etc. Patient prefers to start today with hormone testing and prescription for clomid.   LMP: Patient's last menstrual period was 02/16/2017. Average Interval: regular, 28 days Duration of flow: 7 days Heavy Menses: no Clots: no Intermenstrual Bleeding: no Postcoital Bleeding: no Dysmenorrhea: no  The patient is sexually active. She currently uses none for contraception. She denies dyspareunia.  The patient does not perform self breast exams.  There is no notable family history of breast or ovarian cancer in her family.  The patient wears seatbelts: yes.   The patient has regular exercise: She is active with her children but admits to decreased cardio activity.    The patient denies current symptoms of depression.  She admits to some stress related to daily life but denies need for medication.  Review of Systems: Review of Systems  Constitutional: Negative.   HENT: Negative.   Eyes: Negative.   Respiratory: Negative.   Cardiovascular: Negative.   Gastrointestinal: Negative.   Genitourinary: Negative.   Musculoskeletal: Negative.   Skin:  Negative.   Neurological: Negative.   Endo/Heme/Allergies: Negative.   Psychiatric/Behavioral: Negative.     Past Medical History:  Past Medical History:  Diagnosis Date  . Chicken pox   . Seasonal allergies     Past Surgical History:  Past Surgical History:  Procedure Laterality Date  . BREAST BIOPSY Right 2003   extra breast tissue in right breast under arm    Gynecologic History:  Patient's last menstrual period was 02/16/2017. Contraception: none Last Pap: 1 year ago Results were: no abnormalities   Obstetric History: [id number]  Family History:  Family History  Problem Relation Age of Onset  . Arthritis Mother   . Cancer Mother        colon  . Hyperlipidemia Mother   . Mental illness Mother   . Hyperlipidemia Father   . Stroke Father   . Hypertension Father   . Arthritis Maternal Aunt   . Cancer Maternal Aunt        colon  . Arthritis Maternal Grandmother   . Cancer Maternal Grandfather        lung  . Diabetes Paternal Grandmother   . Alcohol abuse Paternal Grandfather     Social History:  Social History   Socioeconomic History  . Marital status: Married    Spouse name: Not on file  . Number of children: Not on file  . Years of education: Not on file  . Highest education level: Not on file  Social Needs  . Financial resource strain: Not on file  . Food insecurity - worry: Not on file  . Food insecurity - inability: Not on file  .  Transportation needs - medical: Not on file  . Transportation needs - non-medical: Not on file  Occupational History  . Not on file  Tobacco Use  . Smoking status: Never Smoker  . Smokeless tobacco: Never Used  Substance and Sexual Activity  . Alcohol use: Yes    Alcohol/week: 0.0 - 0.6 oz    Comment: occ  . Drug use: No  . Sexual activity: Yes    Partners: Male  Other Topics Concern  . Not on file  Social History Narrative  . Not on file    Allergies:  Allergies  Allergen Reactions  . Sulfa Antibiotics  Nausea And Vomiting  . Ciprofloxacin Rash  . Mucinex Dm [Dm-Guaifenesin Er] Hypertension    Felt dizzy    Medications: Prior to Admission medications   Medication Sig Start Date End Date Taking? Authorizing Provider  cetirizine (ZYRTEC) 10 MG tablet Take 10 mg by mouth daily.   Yes [provider]  meloxicam (MOBIC) 7.5 MG tablet TAKE 1 TABLET BY MOUTH EVERY DAY 12/11/16  Yes Burnard Hawthorne, FNP  PROAIR HFA 108 (90 Base) MCG/ACT inhaler INHALE 2 PUFFS INTO THE LUNGS EVERY 6 (SIX) HOURS AS NEEDED FOR WHEEZING OR SHORTNESS OF BREATH. 02/02/16  Yes Cook, Jayce G, DO  triamcinolone (NASACORT) 55 MCG/ACT AERO nasal inhaler Place 2 sprays into the nose daily.   Yes [provider]    Physical Exam Vitals: Blood pressure 100/66, pulse 72, height 5\' 7"  (1.702 m), weight 148 lb (67.1 kg), last menstrual period 02/16/2017.  General: NAD HEENT: normocephalic, anicteric Thyroid: no enlargement, no palpable nodules Pulmonary: No increased work of breathing, CTAB Cardiovascular: RRR, distal pulses 2+ Breast: Breast symmetrical, no tenderness, no palpable nodules or masses, no skin or nipple retraction present, no nipple discharge.  No axillary or supraclavicular lymphadenopathy. Abdomen: NABS, soft, non-tender, non-distended.  Umbilicus without lesions.  No hepatomegaly, splenomegaly or masses palpable. No evidence of hernia  Genitourinary:  External: Normal external female genitalia.  Normal urethral meatus, normal  Bartholin's and Skene's glands.    Vagina: Normal vaginal mucosa, no evidence of prolapse.    Cervix: Not evaluated for PAP screening interval  Uterus: Non-enlarged, mobile, normal contour.  Non-tender to palpation  Adnexa: ovaries non-enlarged, no adnexal masses. Non-tender to palpation  Rectal: deferred  Lymphatic: no evidence of inguinal lymphadenopathy Extremities: no edema, erythema, or tenderness Neurologic: Grossly intact Psychiatric: mood appropriate,  affect full   Assessment: 36 y.o. G2P2002 routine annual exam  Plan: Problem List Items Addressed This Visit    None    Visit Diagnoses    Well woman exam with routine gynecological exam    -  Primary   Fertility testing       Relevant Orders   FSH/LH   Progesterone   Estradiol   Testosterone,Free and Total   Anti mullerian hormone   Female fertility problem       Relevant Medications   clomiPHENE (CLOMID) 50 MG tablet      1) STI screening was offered and declined  2) ASCCP guidelines and rational discussed.  Patient opts for every 3 years screening interval  3) Contraception - None- attempting conception  4) Routine healthcare maintenance including cholesterol, diabetes screening discussed and completed at visit last year  5) Follow up 1 year for routine annual exam  Rod Can, CNM

## 2017-03-01 NOTE — Patient Instructions (Signed)

## 2017-03-09 LAB — ANTI MULLERIAN HORMONE: ANTI-MULLERIAN HORMONE (AMH): 0.603 ng/mL

## 2017-03-09 LAB — ESTRADIOL: ESTRADIOL: 382.4 pg/mL

## 2017-03-09 LAB — FSH/LH
FSH: 5.2 m[IU]/mL
LH: 20.6 m[IU]/mL

## 2017-03-09 LAB — TESTOSTERONE,FREE AND TOTAL
TESTOSTERONE: 20 ng/dL (ref 8–48)
Testosterone, Free: 2.3 pg/mL (ref 0.0–4.2)

## 2017-03-09 LAB — PROGESTERONE: Progesterone: 0.7 ng/mL

## 2017-05-09 ENCOUNTER — Telehealth: Payer: Self-pay

## 2017-05-09 NOTE — Telephone Encounter (Signed)
Pt had a pos home preg test and has concerns.  [phone].  Pt was dx'd c the flu last Fri.  Has been taking tamiflu and ibuprophen.  The preg test was from the [facility].  She went to [employer] and purchased more preg test.  Took two and both were negative.  Period in Jan lasted only four days and wasn't as heavy as usual and it was late.  Period this month she started early but no flow, just with wiping.  She felt fine yesterday but got up this am with a bad wave of nausea/dry heaves.  Is concerned too that she may be further along than a few weeks.  Adv to take e.s. Tylenol instead of ibuprophen, tamiflu is okay during preg, wait a week or two and repeat home preg tests.

## 2017-06-29 ENCOUNTER — Encounter: Payer: Self-pay | Admitting: Family

## 2017-06-29 ENCOUNTER — Telehealth: Payer: Self-pay | Admitting: Family

## 2017-06-29 DIAGNOSIS — L259 Unspecified contact dermatitis, unspecified cause: Secondary | ICD-10-CM

## 2017-06-29 MED ORDER — TRIAMCINOLONE ACETONIDE 0.025 % EX CREA: 1.0000 "application " | TOPICAL_CREAM | Freq: Two times a day (BID) | CUTANEOUS | 1 refills | Status: DC

## 2017-06-29 MED ORDER — PREDNISONE 10 MG PO TABS
ORAL_TABLET | ORAL | 0 refills | Status: DC
Start: 2017-06-29 — End: 2018-05-16

## 2017-06-29 NOTE — Telephone Encounter (Signed)
I saw this patient when dropping of my daughter for daycare this morning.   She was very polite and asked that I look at a rash on her right forearm which she stated started 6 days ago when she was in the yard doing yard work.  She suspects it is poison ivy.  Describes it as oozy, with clear serous fluid, very itchy especially in the morning at night. No h/o cellulitis.    She is been using cortisone cream, taking her Zyrtec with some relief.  She is concerned as it has now spread to her right lower leg.  Otherwise she feels well.  She denies any fever, chills.  I felt comfortable prescribing her a topical prescription strength steroid cream.  Advised her to start with this first.  However since it is Friday, if over the weekend the rash does not improve, she will go ahead and start a short course of prednisone taper.  We discussed at length the side effects of prednisone and how to take the medication ( doses prior to 2pm) .  She will give our office a call with any new or worsening symptoms and ask for an appointment

## 2018-05-02 ENCOUNTER — Other Ambulatory Visit: Payer: Self-pay | Admitting: Family

## 2018-05-02 DIAGNOSIS — M546 Pain in thoracic spine: Secondary | ICD-10-CM

## 2018-05-06 ENCOUNTER — Ambulatory Visit (INDEPENDENT_AMBULATORY_CARE_PROVIDER_SITE_OTHER): Payer: BC Managed Care – PPO

## 2018-05-06 ENCOUNTER — Encounter: Payer: Self-pay | Admitting: Family

## 2018-05-06 ENCOUNTER — Ambulatory Visit: Payer: BC Managed Care – PPO | Admitting: Family

## 2018-05-06 VITALS — BP 120/72 | HR 106 | Temp 98.1°F | Wt 159.4 lb

## 2018-05-06 DIAGNOSIS — Z3200 Encounter for pregnancy test, result unknown: Secondary | ICD-10-CM | POA: Diagnosis not present

## 2018-05-06 DIAGNOSIS — M546 Pain in thoracic spine: Secondary | ICD-10-CM

## 2018-05-06 DIAGNOSIS — R300 Dysuria: Secondary | ICD-10-CM | POA: Diagnosis not present

## 2018-05-06 DIAGNOSIS — Z3169 Encounter for other general counseling and advice on procreation: Secondary | ICD-10-CM

## 2018-05-06 DIAGNOSIS — Z3202 Encounter for pregnancy test, result negative: Secondary | ICD-10-CM | POA: Diagnosis not present

## 2018-05-06 LAB — POCT URINALYSIS DIPSTICK
Bilirubin, UA: NEGATIVE
Glucose, UA: NEGATIVE
Ketones, UA: NEGATIVE
LEUKOCYTES UA: NEGATIVE
NITRITE UA: NEGATIVE
PROTEIN UA: NEGATIVE
SPEC GRAV UA: 1.01 (ref 1.010–1.025)
Urobilinogen, UA: 0.2 E.U./dL
pH, UA: 6 (ref 5.0–8.0)

## 2018-05-06 LAB — URINALYSIS, MICROSCOPIC ONLY: RBC / HPF: NONE SEEN (ref 0–?)

## 2018-05-06 LAB — POCT URINE PREGNANCY: Preg Test, Ur: NEGATIVE

## 2018-05-06 MED ORDER — MELOXICAM 7.5 MG PO TABS: 7.5000 mg | ORAL_TABLET | Freq: Every day | ORAL | 1 refills | Status: DC

## 2018-05-06 MED ORDER — METHOCARBAMOL 500 MG PO TABS: 500.0000 mg | ORAL_TABLET | Freq: Every evening | ORAL | 1 refills | Status: DC | PRN

## 2018-05-06 NOTE — Patient Instructions (Addendum)
Think a good idea to trial Pepcid AC.   Trial of robaxin  May start mobic for short course until you see orthopedics.  Please follow up with [facility].   Heat heat.   Today we discussed referrals, orders. orthopedics   I have placed these orders in the system for you.  Please be sure to give [place] a call if you have not heard from our office regarding this. We should hear from [place] within ONE week with information regarding your appointment. If not, please let me know immediately.      Let me know how you are doing

## 2018-05-06 NOTE — Assessment & Plan Note (Signed)
Localized pain to right scapular area. XR thoracic back shows degeneration at c spine. Considering spasm, sprain. Given mobic, robaxinl advised heat. Referral to orthopedics due to chronicity of symptom.  Pending urine due to known right non obstructing renal stone.

## 2018-05-06 NOTE — Assessment & Plan Note (Addendum)
Patient is considering pregnancy. Long discussion and we agreed she would follow up with [facility] for annual, labs, further discussion. She will contact them since she is established. Will follow

## 2018-05-06 NOTE — Progress Notes (Signed)
Subjective:    Patient ID: Jodi Wright, female    DOB: 28-Mar-1980, 38 y.o.   MRN: [id number]  CC: Jodi Wright is a 38 y.o. female who presents today for an acute visit.    HPI: CC: bilateral mid to upper back pain, now more right sided, for past 2 years,  worsened over the past week.  One instance of sharp pain in right upper back last week which she placed right arm over back of couch. Describes as cramp. Right upper back pain improved with 800mg  ibuprofen.  Would like refill of mobicPain is worse in morning and at the end of day. Works at [employer] job.   Occasionally has tingling in right arm, not lately. Starts in right forearm to hand.   Stretching twice per day, with some relief however doesn't completely go away.   8 days ago had a 'stomach bug' with diarrhea, nausea since resolved. Son had had 'GI bug' at that time. One episode of epigastric pain which resolved with tums.   No fever, dysuria , foul taste in mouth, belching, cough, congestion.   Still getting regular menses cycles. Feels heavier than normal however no clots. Had been seen by CNM at [facility]. Cycles are less than 28 days.  Considering conception.      seen 2018 for left low back pain. Suspected UTI, MS etiology at that time.   CT renal stone, non obstructing  HISTORY:  Past Medical History:  Diagnosis Date  . Chicken pox   . Seasonal allergies    Past Surgical History:  Procedure Laterality Date  . BREAST BIOPSY Right 2003   extra breast tissue in right breast under arm   Family History  Problem Relation Age of Onset  . Arthritis Mother   . Cancer Mother        colon  . Hyperlipidemia Mother   . Mental illness Mother   . Hyperlipidemia Father   . Stroke Father   . Hypertension Father   . Arthritis Maternal Aunt   . Cancer Maternal Aunt        colon  . Arthritis Maternal Grandmother   . Cancer Maternal Grandfather        lung  . Diabetes Paternal Grandmother   . Alcohol abuse Paternal  Grandfather     Allergies: Sulfa antibiotics; Ciprofloxacin; and Mucinex dm [dm-guaifenesin er] Current Outpatient Medications on File Prior to Visit  Medication Sig Dispense Refill  . cetirizine (ZYRTEC) 10 MG tablet Take 10 mg by mouth daily.    Marland Kitchen PROAIR HFA 108 (90 Base) MCG/ACT inhaler INHALE 2 PUFFS INTO THE LUNGS EVERY 6 (SIX) HOURS AS NEEDED FOR WHEEZING OR SHORTNESS OF BREATH. 8.5 Inhaler 0  . triamcinolone (NASACORT) 55 MCG/ACT AERO nasal inhaler Place 2 sprays into the nose daily.    . predniSONE (DELTASONE) 10 MG tablet Take 4 tablets ( total 40 mg) by mouth for 2 days; take 3 tablets ( total 30 mg) by mouth for 2 days; take 2 tablets ( total 20 mg) by mouth for 1 day; take 1 tablet ( total 10 mg) by mouth for 1 day. 17 tablet 0  . triamcinolone (KENALOG) 0.025 % cream Apply 1 application topically 2 (two) times daily. 15 g 1   No current facility-administered medications on file prior to visit.     Social History   Tobacco Use  . Smoking status: Never Smoker  . Smokeless tobacco: Never Used  Substance Use Topics  . Alcohol use: Yes  Alcohol/week: 0.0 - 1.0 standard drinks    Comment: occ  . Drug use: No    Review of Systems  Constitutional: Negative for chills and fever.  Respiratory: Negative for cough.   Cardiovascular: Negative for chest pain and palpitations.  Gastrointestinal: Negative for abdominal distention, abdominal pain, diarrhea (resolved), nausea and vomiting.  Genitourinary: Negative for dysuria, flank pain, hematuria, pelvic pain, urgency and vaginal pain.  Musculoskeletal: Positive for back pain.  Neurological: Negative for numbness (none today).      Objective:    BP 120/72 (BP Location: Left Arm, Patient Position: Sitting, Cuff Size: Normal)   Pulse (!) 106   Temp 98.1 F (36.7 C)   Wt 159 lb 6.4 oz (72.3 kg)   SpO2 96%   BMI 24.97 kg/m    Physical Exam Vitals signs reviewed.  Constitutional:      Appearance: She is well-developed.    Eyes:     Conjunctiva/sclera: Conjunctivae normal.  Cardiovascular:     Rate and Rhythm: Normal rate and regular rhythm.     Pulses: Normal pulses.     Heart sounds: Normal heart sounds.  Pulmonary:     Effort: Pulmonary effort is normal.     Breath sounds: Normal breath sounds. No wheezing, rhonchi or rales.  Musculoskeletal:     Right shoulder: She exhibits normal range of motion, no tenderness, no deformity and no pain.     Left shoulder: She exhibits normal range of motion, no tenderness, no pain and no spasm.     Cervical back: She exhibits normal range of motion, no tenderness, no swelling, no pain and no spasm.     Thoracic back: She exhibits tenderness, pain and spasm. She exhibits normal range of motion, no swelling and no edema.       Back:     Comments: Focal tenderness appreciated on exam over medial scapula. No rash. No swelling.  Spasm.   Skin:    General: Skin is warm and dry.  Neurological:     Mental Status: She is alert.  Psychiatric:        Speech: Speech normal.        Behavior: Behavior normal.        Thought Content: Thought content normal.        Assessment & Plan:   Problem List Items Addressed This Visit      Other   Pre-conception counseling    Patient is considering pregnancy. Long discussion and we agreed she would follow up with [facility] for annual, labs, further discussion. She will contact them since she is established. Will follow      Acute bilateral thoracic back pain - Primary    Localized pain to right scapular area. XR thoracic back shows degeneration at c spine. Considering spasm, sprain. Given mobic, robaxinl advised heat. Referral to orthopedics due to chronicity of symptom.  Pending urine due to known right non obstructing renal stone.       Relevant Medications   meloxicam (MOBIC) 7.5 MG tablet   methocarbamol (ROBAXIN) 500 MG tablet   Other Relevant Orders   POCT urinalysis dipstick (Completed)   Ambulatory referral  to Orthopedic Surgery   DG Thoracic Spine W/Swimmers (Completed)    Other Visit Diagnoses    Dysuria       Relevant Orders   Urine Microscopic Only   Encounter for pregnancy test, result unknown       Relevant Orders   POCT urine pregnancy (Completed)  Negative pregnancy test            I have changed Jodi Wright's meloxicam. I am also having her start on methocarbamol. Additionally, I am having her maintain her PROAIR HFA, cetirizine, triamcinolone, predniSONE, and triamcinolone.   Meds ordered this encounter  Medications  . meloxicam (MOBIC) 7.5 MG tablet    Sig: Take 1 tablet (7.5 mg total) by mouth daily. Use as needed. Take with food.    Dispense:  90 tablet    Refill:  1    Order Specific Question:   Supervising Provider    Answer:   Deborra Medina L [[id number]]  . methocarbamol (ROBAXIN) 500 MG tablet    Sig: Take 1 tablet (500 mg total) by mouth at bedtime as needed for muscle spasms.    Dispense:  90 tablet    Refill:  1    Order Specific Question:   Supervising Provider    Answer:   Crecencio Mc [[id number]]    Return precautions given.   Risks, benefits, and alternatives of the medications and treatment plan prescribed today were discussed, and patient expressed understanding.   Education regarding symptom management and diagnosis given to patient on AVS.  Continue to follow with Burnard Hawthorne, FNP for routine health maintenance.   Jodi Wright and I agreed with plan.   Mable Paris, FNP

## 2018-05-16 ENCOUNTER — Ambulatory Visit (INDEPENDENT_AMBULATORY_CARE_PROVIDER_SITE_OTHER): Payer: BC Managed Care – PPO | Admitting: Family Medicine

## 2018-05-16 ENCOUNTER — Encounter (INDEPENDENT_AMBULATORY_CARE_PROVIDER_SITE_OTHER): Payer: Self-pay | Admitting: Family Medicine

## 2018-05-16 DIAGNOSIS — M546 Pain in thoracic spine: Secondary | ICD-10-CM

## 2018-05-16 MED ORDER — MELOXICAM 15 MG PO TABS: 7.5000 mg | ORAL_TABLET | Freq: Two times a day (BID) | ORAL | 6 refills | Status: DC | PRN

## 2018-05-16 NOTE — Progress Notes (Signed)
Office Visit Note   Patient: Jodi Wright           Date of Birth: 10-19-1980           MRN: [id number] Visit Date: 05/16/2018 Requested by: Burnard Hawthorne, FNP [address] [place], [place] [address] PCP: Burnard Hawthorne, FNP  Subjective: Chief Complaint  Patient presents with  . Middle Back - Pain    Intermittent pain x 2 years. NKI. Feels like "something is out of place" around the right scapula. Has flareups of worse pain & spasms.    HPI: She is a 38 year old with thoracic pain.  She has had longstanding problems with intermittent tightness in her mid thoracic area since childhood.  She does not recall a specific injury from childhood but she does remember having pain.  A couple years ago it was significantly worse and she was seen in the [place] area and she thinks she had an MRI scan but it does not remember the results.  She has done fairly well with conservative treatment but lately her pain seems to be more intense when it flares up and occasionally she gets pain radiating toward her neck, and occasional tingling in the right arm.  She is right-hand dominant, has not noticed any weakness.  No symptoms in her legs, no bowel or bladder dysfunction.  Meloxicam helps, Flexeril did not.  She had x-rays done recently which we reviewed on computer today showing moderate C5-6 and T5-6 degenerative disc disease.  Very minimal thoracic scoliosis.              ROS: Otherwise noncontributory.  Non-smoker, does drink some soft drinks and has room for improvement with diet.  Not currently on any vitamins or supplements.  No history of vitamin D deficiency to her knowledge.  Objective: Vital Signs: There were no vitals taken for this visit.  Physical Exam:  Back: She has full range of motion of her neck with equivocal Spurling's test on the right, slight tenderness near the C7 spinous process.  Good range of motion of her thoracic spine with no obvious scoliosis on forward  flexion.  Upper extremity strength and reflexes are normal.  She has several tender trigger points to the right of midline in the thoracic area.  Imaging: None today.  Assessment & Plan: 1.  Chronic thoracic pain, possibilities include C5-6 or T5-6 disc protrusions with nerve impingement, vitamin D deficiency, etc. -We will try chiropractic in [place] per Dr. Birdie Sons.  If no improvement then she will contact me and we might order a new MRI scan of the cervical and thoracic spine as well as a vitamin D level.     Procedures: No procedures performed  No notes on file     PMFS History: Patient Active Problem List   Diagnosis Date Noted  . Acute bilateral thoracic back pain 05/06/2018  . Hearing loss 05/10/2016  . Finger injury 09/14/2015  . Mood changes 05/05/2015  . Other fatigue 05/05/2015  . Pre-conception counseling 11/25/2014   Past Medical History:  Diagnosis Date  . Chicken pox   . Seasonal allergies     Family History  Problem Relation Age of Onset  . Arthritis Mother   . Cancer Mother        colon  . Hyperlipidemia Mother   . Mental illness Mother   . Hyperlipidemia Father   . Stroke Father   . Hypertension Father   . Arthritis Maternal Aunt   . Cancer  Maternal Aunt        colon  . Arthritis Maternal Grandmother   . Cancer Maternal Grandfather        lung  . Diabetes Paternal Grandmother   . Alcohol abuse Paternal Grandfather     Past Surgical History:  Procedure Laterality Date  . BREAST BIOPSY Right 2003   extra breast tissue in right breast under arm   Social History   Occupational History  . Not on file  Tobacco Use  . Smoking status: Never Smoker  . Smokeless tobacco: Never Used  Substance and Sexual Activity  . Alcohol use: Yes    Alcohol/week: 0.0 - 1.0 standard drinks    Comment: occ  . Drug use: No  . Sexual activity: Yes    Partners: Male

## 2018-09-23 ENCOUNTER — Other Ambulatory Visit: Payer: Self-pay

## 2018-09-23 ENCOUNTER — Other Ambulatory Visit: Payer: BC Managed Care – PPO

## 2018-09-23 ENCOUNTER — Telehealth: Payer: Self-pay | Admitting: Family Medicine

## 2018-09-23 ENCOUNTER — Ambulatory Visit (INDEPENDENT_AMBULATORY_CARE_PROVIDER_SITE_OTHER): Payer: Managed Care, Other (non HMO) | Admitting: Family Medicine

## 2018-09-23 ENCOUNTER — Encounter: Payer: Self-pay | Admitting: Family Medicine

## 2018-09-23 DIAGNOSIS — R059 Cough, unspecified: Secondary | ICD-10-CM

## 2018-09-23 DIAGNOSIS — Z20822 Contact with and (suspected) exposure to covid-19: Secondary | ICD-10-CM

## 2018-09-23 DIAGNOSIS — S70269A Insect bite (nonvenomous), unspecified hip, initial encounter: Secondary | ICD-10-CM

## 2018-09-23 DIAGNOSIS — Z20828 Contact with and (suspected) exposure to other viral communicable diseases: Secondary | ICD-10-CM | POA: Diagnosis not present

## 2018-09-23 DIAGNOSIS — R05 Cough: Secondary | ICD-10-CM | POA: Diagnosis not present

## 2018-09-23 DIAGNOSIS — W57XXXA Bitten or stung by nonvenomous insect and other nonvenomous arthropods, initial encounter: Secondary | ICD-10-CM

## 2018-09-23 DIAGNOSIS — R509 Fever, unspecified: Secondary | ICD-10-CM

## 2018-09-23 MED ORDER — DOXYCYCLINE HYCLATE 100 MG PO TABS: 100.0000 mg | ORAL_TABLET | Freq: Two times a day (BID) | ORAL | 0 refills | Status: DC

## 2018-09-23 NOTE — Progress Notes (Signed)
Patient ID: Jodi Wright, female   DOB: 29-Sep-1980, 38 y.o.   MRN: [id number]    Virtual Visit via video Note  This visit type was conducted due to national recommendations for restrictions regarding the COVID-19 pandemic (e.g. social distancing).  This format is felt to be most appropriate for this patient at this time.  All issues noted in this document were discussed and addressed.  No physical exam was performed (except for noted visual exam findings with Video Visits).   I connected with Jodi Wright today at 10:00 AM EDT by a video enabled telemedicine application and verified that I am speaking with the correct person using two identifiers. Location patient: home Location provider: [facility] Persons participating in the virtual visit: patient, provider  I discussed the limitations, risks, security and privacy concerns of performing an evaluation and management service by  and the video availability of in person appointments. I also discussed with the patient that there may be a patient responsible charge related to this service. The patient expressed understanding and agreed to proceed.  HPI:  Patient and I connected via video due to tick bite on hip.  Patient states she first noticed the tick on Wednesday of last week when she pulled up her underwear and felt it on her hip.  Believes the tick was on her greater than 24 hours.  Husband was able to remove the tick completely using tweezers.  Does have some redness (about size of silver dollar) and a little bit of bruising on hip from tick bite. Denies bulls-eye type rash.  Also states over the weekend had a temperature in the 99 range and has had a mild cough off and on for the past couple of weeks.  Denies body aches.  Chills.  Denies cough, shortness of breath or wheezing.  Denies GI or GU complaints.  Has been around anyone under suspicion for or confirmed positive of COVID-19.   ROS: See pertinent positives and negatives  per HPI.  Past Medical History:  Diagnosis Date  . Chicken pox   . Seasonal allergies     Past Surgical History:  Procedure Laterality Date  . BREAST BIOPSY Right 2003   extra breast tissue in right breast under arm    Family History  Problem Relation Age of Onset  . Arthritis Mother   . Cancer Mother        colon  . Hyperlipidemia Mother   . Mental illness Mother   . Hyperlipidemia Father   . Stroke Father   . Hypertension Father   . Arthritis Maternal Aunt   . Cancer Maternal Aunt        colon  . Arthritis Maternal Grandmother   . Cancer Maternal Grandfather        lung  . Diabetes Paternal Grandmother   . Alcohol abuse Paternal Grandfather     Social History   Tobacco Use  . Smoking status: Never Smoker  . Smokeless tobacco: Never Used  Substance Use Topics  . Alcohol use: Yes    Alcohol/week: 0.0 - 1.0 standard drinks    Comment: occ    Current Outpatient Medications:  .  cetirizine (ZYRTEC) 10 MG tablet, Take 10 mg by mouth daily., Disp: , Rfl:  .  meloxicam (MOBIC) 15 MG tablet, Take 0.5 tablets (7.5 mg total) by mouth 2 (two) times daily as needed for pain., Disp: 30 tablet, Rfl: 6 .  methocarbamol (ROBAXIN) 500 MG tablet, Take 1 tablet (500 mg total) by mouth  at bedtime as needed for muscle spasms., Disp: 90 tablet, Rfl: 1 .  PROAIR HFA 108 (90 Base) MCG/ACT inhaler, INHALE 2 PUFFS INTO THE LUNGS EVERY 6 (SIX) HOURS AS NEEDED FOR WHEEZING OR SHORTNESS OF BREATH., Disp: 8.5 Inhaler, Rfl: 0 .  triamcinolone (NASACORT) 55 MCG/ACT AERO nasal inhaler, Place 2 sprays into the nose daily., Disp: , Rfl:   EXAM:  GENERAL: alert, oriented, appears well and in no acute distress  HEENT: atraumatic, conjunttiva clear, no obvious abnormalities on inspection of external nose and ears  NECK: normal movements of the head and neck  LUNGS: on inspection no signs of respiratory distress, breathing rate appears normal, no obvious gross SOB, gasping or wheezing  CV: no  obvious cyanosis  MS: moves all visible extremities without noticeable abnormality  SKIN: Circular red area on hip with small scabbed area in center consistent with reports tick bite.  Appears to be about the size of a silver dollar as patient reported.  No obvious drainage from site.  No bull's-eye type rash.  PSYCH/NEURO: pleasant and cooperative, no obvious depression or anxiety, speech and thought processing grossly intact  ASSESSMENT AND PLAN:  Discussed the following assessment and plan:  Tick bite of hip-due to tick being on her for most likely more than 24 hours we will cover her with doxycycline twice daily for 10 days.  Advised to monitor self for any worsening redness, development of bull's-eye rash, fever or chills, body aches and let us know right away if any new symptoms occur.  Fever, cough, suspected COVID-19- potentially could attribute fever to tick bite.  Cough could be attributed to seasonal allergies.  However, due to community widespread of COVID-19 we cannot be sure.  Patient is agreeable to get testing.  She is aware nurse will call her to give her time to go to drive-through testing facility.  Patient aware after she is tested she must remain on self quarantine at home until results are back.  Recommended Tylenol as needed for any aches or pains, recommended good fluid intake, recommended diligent handwashing and plenty of rest.  Also advised to call office right away if any new symptoms occur such as shortness of breath, worsening cough, fever that is not helped with Tylenol, vomiting or diarrhea so we can give advice accordingly.   I discussed the assessment and treatment plan with the patient. The patient was provided an opportunity to ask questions and all were answered. The patient agreed with the plan and demonstrated an understanding of the instructions.   The patient was advised to call back or seek an in-person evaluation if the symptoms worsen or if the condition  fails to improve as anticipated.   Jodelle Green, FNP

## 2018-09-23 NOTE — Telephone Encounter (Signed)
Jodi Wright  03-11-1981  [id number]  Needs covid testing --- cough, fever  [place], [phone]

## 2018-09-23 NOTE — Telephone Encounter (Signed)
Scheduled patient for COVID 19 test today at [facility] at 11:30 am.  Testing protocol reviewed.

## 2018-09-26 LAB — NOVEL CORONAVIRUS, NAA: SARS-CoV-2, NAA: NOT DETECTED

## 2019-03-19 ENCOUNTER — Ambulatory Visit (INDEPENDENT_AMBULATORY_CARE_PROVIDER_SITE_OTHER): Payer: BC Managed Care – PPO | Admitting: Advanced Practice Midwife

## 2019-03-19 ENCOUNTER — Other Ambulatory Visit (HOSPITAL_COMMUNITY)
Admission: RE | Admit: 2019-03-19 | Discharge: 2019-03-19 | Disposition: A | Discharge status: Home / Self Care | Payer: Managed Care, Other (non HMO) | Source: Ambulatory Visit | Attending: Advanced Practice Midwife | Admitting: Advanced Practice Midwife

## 2019-03-19 ENCOUNTER — Other Ambulatory Visit: Payer: Self-pay

## 2019-03-19 VITALS — BP 122/74 | Ht 67.0 in | Wt 160.0 lb

## 2019-03-19 DIAGNOSIS — Z124 Encounter for screening for malignant neoplasm of cervix: Secondary | ICD-10-CM | POA: Diagnosis present

## 2019-03-19 DIAGNOSIS — Z01419 Encounter for gynecological examination (general) (routine) without abnormal findings: Secondary | ICD-10-CM | POA: Insufficient documentation

## 2019-03-19 DIAGNOSIS — Z0000 Encounter for general adult medical examination without abnormal findings: Secondary | ICD-10-CM

## 2019-03-19 LAB — HM PAP SMEAR: HM Pap smear: NORMAL

## 2019-03-19 NOTE — Patient Instructions (Signed)
Health Maintenance, Female Adopting a healthy lifestyle and getting preventive care are important in promoting health and wellness. Ask your health care provider about:  The right schedule for you to have regular tests and exams.  Things you can do on your own to prevent diseases and keep yourself healthy. What should I know about diet, weight, and exercise? Eat a healthy diet   Eat a diet that includes plenty of vegetables, fruits, low-fat dairy products, and lean protein.  Do not eat a lot of foods that are high in solid fats, added sugars, or sodium. Maintain a healthy weight Body mass index (BMI) is used to identify weight problems. It estimates body fat based on height and weight. Your health care provider can help determine your BMI and help you achieve or maintain a healthy weight. Get regular exercise Get regular exercise. This is one of the most important things you can do for your health. Most adults should:  Exercise for at least 150 minutes each week. The exercise should increase your heart rate and make you sweat (moderate-intensity exercise).  Do strengthening exercises at least twice a week. This is in addition to the moderate-intensity exercise.  Spend less time sitting. Even light physical activity can be beneficial. Watch cholesterol and blood lipids Have your blood tested for lipids and cholesterol at 40 years of age, then have this test every 5 years. Have your cholesterol levels checked more often if:  Your lipid or cholesterol levels are high.  You are older than 40 years of age.  You are at high risk for heart disease. What should I know about cancer screening? Depending on your health history and family history, you may need to have cancer screening at various ages. This may include screening for:  Breast cancer.  Cervical cancer.  Colorectal cancer.  Skin cancer.  Lung cancer. What should I know about heart disease, diabetes, and high blood  pressure? Blood pressure and heart disease  High blood pressure causes heart disease and increases the risk of stroke. This is more likely to develop in people who have high blood pressure readings, are of African descent, or are overweight.  Have your blood pressure checked: ? Every 3-5 years if you are 18-39 years of age. ? Every year if you are 40 years old or older. Diabetes Have regular diabetes screenings. This checks your fasting blood sugar level. Have the screening done:  Once every three years after age 40 if you are at a normal weight and have a low risk for diabetes.  More often and at a younger age if you are overweight or have a high risk for diabetes. What should I know about preventing infection? Hepatitis B If you have a higher risk for hepatitis B, you should be screened for this virus. Talk with your health care provider to find out if you are at risk for hepatitis B infection. Hepatitis C Testing is recommended for:  Everyone born from 1945 through 1965.  Anyone with known risk factors for hepatitis C. Sexually transmitted infections (STIs)  Get screened for STIs, including gonorrhea and chlamydia, if: ? You are sexually active and are younger than 40 years of age. ? You are older than 40 years of age and your health care provider tells you that you are at risk for this type of infection. ? Your sexual activity has changed since you were last screened, and you are at increased risk for chlamydia or gonorrhea. Ask your health care provider if   you are at risk.  Ask your health care provider about whether you are at high risk for HIV. Your health care provider may recommend a prescription medicine to help prevent HIV infection. If you choose to take medicine to prevent HIV, you should first get tested for HIV. You should then be tested every 3 months for as long as you are taking the medicine. Pregnancy  If you are about to stop having your period (premenopausal) and  you may become pregnant, seek counseling before you get pregnant.  Take 400 to 800 micrograms (mcg) of folic acid every day if you become pregnant.  Ask for birth control (contraception) if you want to prevent pregnancy. Osteoporosis and menopause Osteoporosis is a disease in which the bones lose minerals and strength with aging. This can result in bone fractures. If you are 65 years old or older, or if you are at risk for osteoporosis and fractures, ask your health care provider if you should:  Be screened for bone loss.  Take a calcium or vitamin D supplement to lower your risk of fractures.  Be given hormone replacement therapy (HRT) to treat symptoms of menopause. Follow these instructions at home: Lifestyle  Do not use any products that contain nicotine or tobacco, such as cigarettes, e-cigarettes, and chewing tobacco. If you need help quitting, ask your health care provider.  Do not use street drugs.  Do not share needles.  Ask your health care provider for help if you need support or information about quitting drugs. Alcohol use  Do not drink alcohol if: ? Your health care provider tells you not to drink. ? You are pregnant, may be pregnant, or are planning to become pregnant.  If you drink alcohol: ? Limit how much you use to 0-1 drink a day. ? Limit intake if you are breastfeeding.  Be aware of how much alcohol is in your drink. In the [place] one drink equals one 12 oz bottle of beer (355 mL), one 5 oz glass of wine (148 mL), or one 1 oz glass of hard liquor (44 mL). General instructions  Schedule regular health, dental, and eye exams.  Stay current with your vaccines.  Tell your health care provider if: ? You often feel depressed. ? You have ever been abused or do not feel safe at home. Summary  Adopting a healthy lifestyle and getting preventive care are important in promoting health and wellness.  Follow your health care provider's instructions about healthy  diet, exercising, and getting tested or screened for diseases.  Follow your health care provider's instructions on monitoring your cholesterol and blood pressure. This information is not intended to replace advice given to you by your health care provider. Make sure you discuss any questions you have with your health care provider. Document Released: 09/26/2010 Document Revised: 03/06/2018 Document Reviewed: 03/06/2018 Elsevier Patient Education  2020 Elsevier Inc[employer]  

## 2019-03-20 ENCOUNTER — Encounter: Payer: Self-pay | Admitting: Advanced Practice Midwife

## 2019-03-20 LAB — THYROID PANEL WITH TSH
Free Thyroxine Index: 2 (ref 1.2–4.9)
T3 Uptake Ratio: 25 % (ref 24–39)
T4, Total: 8 ug/dL (ref 4.5–12.0)
TSH: 2.22 u[IU]/mL (ref 0.450–4.500)

## 2019-03-20 NOTE — Progress Notes (Signed)
Gynecology Annual Exam  PCP: Burnard Hawthorne, FNP  Chief Complaint:  Chief Complaint  Patient presents with  . Annual Exam    History of Present Illness: Patient is a 38 y.o. [id number] presents for annual exam. The patient has no gyn complaints today. She and her husband are still trying to conceive. She has an appointment with fertility specialist and they requested she have an annual Gyn exam including PAP smear prior to that. She also request thyroid function test.   LMP: Patient's last menstrual period was 02/27/2019. Average Interval: regular, 28 days Duration of flow: 6 days Heavy Menses: varies from moderate to heavy Clots: no Intermenstrual Bleeding: no Postcoital Bleeding: no Dysmenorrhea: no   The patient is sexually active. She currently uses none for contraception. She denies dyspareunia.  The patient does perform self breast exams.  There is no notable family history of breast or ovarian cancer in her family.  The patient wears seatbelts: yes.   The patient has regular exercise: she walks regularly. She admits healthy lifestyle diet, hydration and sleep.    The patient denies current symptoms of depression.  She does mention having some mood swings and daily hot flashes of a few minutes duration.   Review of Systems: Review of Systems  Constitutional: Negative.   HENT: Negative.   Eyes: Negative.   Respiratory: Negative.   Cardiovascular: Negative.   Gastrointestinal: Negative.   Genitourinary: Negative.   Musculoskeletal: Negative.   Skin: Negative.   Neurological: Negative.   Endo/Heme/Allergies: Positive for environmental allergies.       Hot flashes  Psychiatric/Behavioral: Negative.     Past Medical History:  Past Medical History:  Diagnosis Date  . Chicken pox   . Seasonal allergies     Past Surgical History:  Past Surgical History:  Procedure Laterality Date  . BREAST BIOPSY Right 2003   extra breast tissue in right breast under arm     Gynecologic History:  Patient's last menstrual period was 02/27/2019. Contraception: none Last Pap: 3 or 4 years ago Results were:  no abnormalities  Last mammogram: Has not had a mammogram  Obstetric History: [id number]  Family History:  Family History  Problem Relation Age of Onset  . Arthritis Mother   . Cancer Mother        colon  . Hyperlipidemia Mother   . Mental illness Mother   . Hyperlipidemia Father   . Stroke Father   . Hypertension Father   . Arthritis Maternal Aunt   . Cancer Maternal Aunt        colon  . Arthritis Maternal Grandmother   . Cancer Maternal Grandfather        lung  . Diabetes Paternal Grandmother   . Alcohol abuse Paternal Grandfather     Social History:  Social History   Socioeconomic History  . Marital status: Married    Spouse name: Not on file  . Number of children: Not on file  . Years of education: Not on file  . Highest education level: Not on file  Occupational History  . Not on file  Tobacco Use  . Smoking status: Never Smoker  . Smokeless tobacco: Never Used  Substance and Sexual Activity  . Alcohol use: Yes    Alcohol/week: 0.0 - 1.0 standard drinks    Comment: occ  . Drug use: No  . Sexual activity: Yes    Partners: Male  Other Topics Concern  . Not on file  Social History Narrative  .  Not on file   Social Determinants of Health   Financial Resource Strain:   . Difficulty of Paying Living Expenses: Not on file  Food Insecurity:   . Worried About Charity fundraiser in the Last Year: Not on file  . Ran Out of Food in the Last Year: Not on file  Transportation Needs:   . Lack of Transportation (Medical): Not on file  . Lack of Transportation (Non-Medical): Not on file  Physical Activity:   . Days of Exercise per Week: Not on file  . Minutes of Exercise per Session: Not on file  Stress:   . Feeling of Stress : Not on file  Social Connections:   . Frequency of Communication with Friends and Family: Not on  file  . Frequency of Social Gatherings with Friends and Family: Not on file  . Attends Religious Services: Not on file  . Active Member of Clubs or Organizations: Not on file  . Attends Archivist Meetings: Not on file  . Marital Status: Not on file  Intimate Partner Violence:   . Fear of Current or Ex-Partner: Not on file  . Emotionally Abused: Not on file  . Physically Abused: Not on file  . Sexually Abused: Not on file    Allergies:  Allergies  Allergen Reactions  . Sulfa Antibiotics Nausea And Vomiting  . Ciprofloxacin Rash  . Mucinex Dm [Dm-Guaifenesin Er] Hypertension    Felt dizzy    Medications: Prior to Admission medications   Medication Sig Start Date End Date Taking? Authorizing Provider  cetirizine (ZYRTEC) 10 MG tablet Take 10 mg by mouth daily.   Yes [provider]  meloxicam (MOBIC) 15 MG tablet Take 0.5 tablets (7.5 mg total) by mouth 2 (two) times daily as needed for pain. 05/16/18  Yes Hilts, Legrand Como, MD  triamcinolone (NASACORT) 55 MCG/ACT AERO nasal inhaler Place 2 sprays into the nose daily.   Yes [provider]  doxycycline (VIBRA-TABS) 100 MG tablet Take 1 tablet (100 mg total) by mouth 2 (two) times daily. Patient not taking: Reported on 03/19/2019 09/23/18   Jodelle Green, FNP  methocarbamol (ROBAXIN) 500 MG tablet Take 1 tablet (500 mg total) by mouth at bedtime as needed for muscle spasms. Patient not taking: Reported on 03/19/2019 05/06/18   Burnard Hawthorne, FNP  PROAIR HFA 108 [phone] Base) MCG/ACT inhaler INHALE 2 PUFFS INTO THE LUNGS EVERY 6 (SIX) HOURS AS NEEDED FOR WHEEZING OR SHORTNESS OF BREATH. Patient not taking: Reported on 03/19/2019 02/02/16   Coral Spikes, DO    Physical Exam Vitals: Blood pressure 122/74, height 5\' 7"  (1.702 m), weight 160 lb (72.6 kg), last menstrual period 02/27/2019.  General: NAD HEENT: normocephalic, anicteric Thyroid: no enlargement, no palpable nodules Pulmonary: No increased work of  breathing, CTAB Cardiovascular: RRR, distal pulses 2+ Breast: Breast symmetrical, no tenderness, no palpable nodules or masses, no skin or nipple retraction present, no nipple discharge.  No axillary or supraclavicular lymphadenopathy. Abdomen: NABS, soft, non-tender, non-distended.  Umbilicus without lesions.  No hepatomegaly, splenomegaly or masses palpable. No evidence of hernia  Genitourinary:  External: Normal external female genitalia.  Normal urethral meatus, normal Bartholin's and Skene's glands.    Vagina: Normal vaginal mucosa, no evidence of prolapse.    Cervix: Grossly normal in appearance, no bleeding, no CMT  Uterus: Non-enlarged, mobile, normal contour.    Adnexa: ovaries non-enlarged, no adnexal masses  Rectal: deferred  Lymphatic: no evidence of inguinal lymphadenopathy Extremities: no edema,  erythema, or tenderness Neurologic: Grossly intact Psychiatric: mood appropriate, affect full    Assessment: 38 y.o. [id number] routine annual exam  Plan: Problem List Items Addressed This Visit    None    Visit Diagnoses    Well woman exam with routine gynecological exam    -  Primary   Relevant Orders   Mammogram Screening Routine   Thyroid Panel W TSH (LC) (Completed)   Cytology - PAP   Blood tests for routine general physical examination       Relevant Orders   Thyroid Panel W TSH (LC) (Completed)   Cervical cancer screening       Relevant Orders   Cytology - PAP      1) Mammogram - recommend yearly screening mammogram.  Mammogram Was ordered today  2) STI screening  was offered and declined  3) ASCCP guidelines and rationale discussed.  Patient opts for every 3 years screening interval  4) Contraception - the patient is currently using  none.  She is attempting to conceive in the near future  5) Colonoscopy -- Screening recommended starting at age 52 for average risk individuals, age 74 for individuals deemed at increased risk (including African Americans) and  recommended to continue until age 51.  For patient age 56-85 individualized approach is recommended.  Gold standard screening is via colonoscopy, Cologuard screening is an acceptable alternative for patient unwilling or unable to undergo colonoscopy.  "Colorectal cancer screening for average?risk adults: 2018 guideline update from the American Cancer Society"CA: A Cancer Journal for Clinicians: Aug 23, 2016   6) Routine healthcare maintenance including cholesterol, diabetes screening discussed managed by PCP. Thyroid function test ordered today.  7) Return in 1 year (on 03/18/2020) for annual established gyn.   Rod Can, [facility] 03/20/2019, 12:55 PM

## 2019-03-26 LAB — CYTOLOGY - PAP
Comment: NEGATIVE
Diagnosis: NEGATIVE
Diagnosis: REACTIVE
High risk HPV: NEGATIVE

## 2019-06-04 ENCOUNTER — Other Ambulatory Visit: Payer: Self-pay | Admitting: Family

## 2019-06-04 DIAGNOSIS — M546 Pain in thoracic spine: Secondary | ICD-10-CM

## 2020-06-10 ENCOUNTER — Other Ambulatory Visit: Payer: Self-pay

## 2020-06-10 ENCOUNTER — Encounter: Payer: Self-pay | Admitting: Advanced Practice Midwife

## 2020-06-10 ENCOUNTER — Ambulatory Visit (INDEPENDENT_AMBULATORY_CARE_PROVIDER_SITE_OTHER): Payer: BC Managed Care – PPO | Admitting: Advanced Practice Midwife

## 2020-06-10 VITALS — BP 143/87 | Ht 67.0 in | Wt 163.0 lb

## 2020-06-10 DIAGNOSIS — Z01419 Encounter for gynecological examination (general) (routine) without abnormal findings: Secondary | ICD-10-CM | POA: Diagnosis not present

## 2020-06-10 DIAGNOSIS — N6325 Unspecified lump in the left breast, overlapping quadrants: Secondary | ICD-10-CM

## 2020-06-10 DIAGNOSIS — R102 Pelvic and perineal pain: Secondary | ICD-10-CM

## 2020-06-10 NOTE — Progress Notes (Signed)
Gynecology Annual Exam  PCP: Burnard Hawthorne, FNP  Chief Complaint:  Chief Complaint  Patient presents with  . Annual Exam    History of Present Illness: Patient is a 40 y.o. G2P2002 presents for annual exam. The patient has complaint today of new breast lump she first noticed about 1 week ago. She has had itchy skin on her breasts on and off for the past year and she noticed the lump when scratching. It has been non-tender until today when she noticed a mild pain in the area.   She also mentions an increase of PMS symptoms in the past 5-6 months- especially back and lower abdominal pain for the week prior through the week after her cycle.  She has not been using any birth control protection and has stopped actively trying to conceive.    LMP: Patient's last menstrual period was 05/24/2020. Average Interval: regular, 28 days Duration of flow: 4-6 days Heavy Menses: varies with cycle- denies very heavy Clots: no Intermenstrual Bleeding: no Postcoital Bleeding: no Dysmenorrhea: yes   The patient is sexually active. She currently uses none for contraception. She denies dyspareunia.  The patient does perform self breast exams.  There is no notable family history of breast or ovarian cancer in her family.  The patient wears seatbelts: yes.   The patient has regular exercise: she is active with her children.    The patient denies current symptoms of depression.    Review of Systems: Review of Systems  Constitutional: Negative for chills and fever.  HENT: Negative for congestion, ear discharge, ear pain, hearing loss, sinus pain and sore throat.   Eyes: Negative for blurred vision and double vision.  Respiratory: Negative for cough, shortness of breath and wheezing.   Cardiovascular: Negative for chest pain, palpitations and leg swelling.  Gastrointestinal: Positive for abdominal pain. Negative for blood in stool, constipation, diarrhea, heartburn, melena, nausea and vomiting.   Genitourinary: Negative for dysuria, flank pain, frequency, hematuria and urgency.  Musculoskeletal: Positive for back pain. Negative for joint pain and myalgias.  Skin: Negative for itching and rash.  Neurological: Negative for dizziness, tingling, tremors, sensory change, speech change, focal weakness, seizures, loss of consciousness, weakness and headaches.  Endo/Heme/Allergies: Negative for environmental allergies. Does not bruise/bleed easily.       Positive for pms symptoms  Psychiatric/Behavioral: Negative for depression, hallucinations, memory loss, substance abuse and suicidal ideas. The patient is not nervous/anxious and does not have insomnia.   Breast: Positive for lump in left breast at 12:00 o'clock, mobile, 1cm slightly irregular borders, 3 cm from nipple, non-tender to palpation. No masses or tenderness in right breast.   Past Medical History:  Patient Active Problem List   Diagnosis Date Noted  . Tick bite of hip, initial encounter 09/23/2018  . Fever 09/23/2018  . Acute bilateral thoracic back pain 05/06/2018  . Hearing loss 05/10/2016  . Finger injury 09/14/2015    Left ring     . Mood changes 05/05/2015  . Other fatigue 05/05/2015  . Pre-conception counseling 11/25/2014    Past Surgical History:  Past Surgical History:  Procedure Laterality Date  . BREAST BIOPSY Right 2003   extra breast tissue in right breast under arm    Gynecologic History:  Patient's last menstrual period was 05/24/2020. Contraception: none Last Pap: 03/19/2019 Results were:  no abnormalities  Last mammogram: remote history Results were: benign  Obstetric History: [id number]  Family History:  Family History  Problem Relation Age of Onset  .  Arthritis Mother   . Cancer Mother        colon  . Hyperlipidemia Mother   . Mental illness Mother   . Hyperlipidemia Father   . Stroke Father   . Hypertension Father   . Arthritis Maternal Aunt   . Cancer Maternal Aunt        colon  .  Arthritis Maternal Grandmother   . Cancer Maternal Grandfather        lung  . Diabetes Paternal Grandmother   . Alcohol abuse Paternal Grandfather     Social History:  Social History   Socioeconomic History  . Marital status: Married    Spouse name: Not on file  . Number of children: Not on file  . Years of education: Not on file  . Highest education level: Not on file  Occupational History  . Not on file  Tobacco Use  . Smoking status: Never Smoker  . Smokeless tobacco: Never Used  Vaping Use  . Vaping Use: Never used  Substance and Sexual Activity  . Alcohol use: Yes    Alcohol/week: 0.0 - 1.0 standard drinks    Comment: occ  . Drug use: No  . Sexual activity: Yes    Partners: Male    Birth control/protection: None  Other Topics Concern  . Not on file  Social History Narrative  . Not on file   Social Determinants of Health   Financial Resource Strain: Not on file  Food Insecurity: Not on file  Transportation Needs: Not on file  Physical Activity: Not on file  Stress: Not on file  Social Connections: Not on file  Intimate Partner Violence: Not on file    Allergies:  Allergies  Allergen Reactions  . Sulfa Antibiotics Nausea And Vomiting  . Ciprofloxacin Rash  . Mucinex Dm [Dm-Guaifenesin Er] Hypertension    Felt dizzy    Medications: Prior to Admission medications   Medication Sig Start Date End Date Taking? Authorizing Provider  cetirizine (ZYRTEC) 10 MG tablet Take 10 mg by mouth daily.   Yes [provider]    Physical Exam Vitals: Blood pressure (!) 143/87, height 5\' 7"  (1.702 m), weight 163 lb (73.9 kg), last menstrual period 05/24/2020.  General: NAD HEENT: normocephalic, anicteric Thyroid: no enlargement, no palpable nodules Pulmonary: No increased work of breathing, CTAB Cardiovascular: RRR, distal pulses 2+ Breast: Breast symmetrical, no tenderness, no palpable nodules or masses, no skin or nipple retraction present, no nipple  discharge.  No axillary or supraclavicular lymphadenopathy. Abdomen: NABS, soft, non-tender, non-distended.  Umbilicus without lesions.  No hepatomegaly, splenomegaly or masses palpable. No evidence of hernia  Genitourinary:  External: Normal external female genitalia.  Normal urethral meatus, normal Bartholin's and Skene's glands.    Vagina: Normal vaginal mucosa, no evidence of prolapse.    Cervix: Grossly normal in appearance, no bleeding, no CMT  Uterus: possibly enlarged- exam difficult due to full bladder, mobile, normal contour.    Adnexa: ovaries non-enlarged, no adnexal masses  Rectal: deferred  Lymphatic: no evidence of inguinal lymphadenopathy Extremities: no edema, erythema, or tenderness Neurologic: Grossly intact Psychiatric: mood appropriate, affect full    Assessment: 40 y.o. G2P2002 routine annual exam  Plan: Problem List Items Addressed This Visit   None   Visit Diagnoses    Well woman exam with routine gynecological exam    -  Primary   Relevant Orders   MM DIAG BREAST TOMO BILATERAL   US BREAST LTD [place] AXILLA   US  PELVIS TRANSVAGINAL NON-OB (TV ONLY)   Breast lump on left side at 12 o'clock position       Relevant Orders   MM DIAG BREAST TOMO BILATERAL   US BREAST LTD UNI LEFT INC AXILLA   Pelvic pain in female       Relevant Orders   US PELVIS TRANSVAGINAL NON-OB (TV ONLY)      1) Mammogram - recommend yearly screening mammogram.  Diagnostic Mammogram Was ordered today   2) STI screening  was offered and declined  3) ASCCP guidelines and rationale discussed.  Patient opts for every 3 years screening interval. Next due in 2023.  4) Contraception - the patient is currently using  none.  She is happy with her current form of contraception and plans to continue  5) Colonoscopy -- Screening recommended starting at age 84 for average risk individuals, age 21 for individuals deemed at increased risk (including African Americans) and recommended to  continue until age 94.  For patient age 74-85 individualized approach is recommended.  Gold standard screening is via colonoscopy, Cologuard screening is an acceptable alternative for patient unwilling or unable to undergo colonoscopy.  "Colorectal cancer screening for average?risk adults: 2018 guideline update from the American Cancer Society"CA: A Cancer Journal for Clinicians: Aug 23, 2016   6) Routine healthcare maintenance including cholesterol, diabetes screening discussed managed by PCP  7) Return in about 1 week (around 06/17/2020) for gyn u/s and follow up.   Rod Can, [facility] 06/10/2020, 4:17 PM

## 2020-06-18 ENCOUNTER — Inpatient Hospital Stay
Admission: RE | Admit: 2020-06-18 | Discharge: 2020-06-18 | Disposition: A | Discharge status: Home / Self Care | Payer: Self-pay | Source: Ambulatory Visit | Attending: *Deleted | Admitting: *Deleted

## 2020-06-18 ENCOUNTER — Other Ambulatory Visit: Payer: Self-pay | Admitting: *Deleted

## 2020-06-18 DIAGNOSIS — Z1231 Encounter for screening mammogram for malignant neoplasm of breast: Secondary | ICD-10-CM

## 2020-06-28 ENCOUNTER — Other Ambulatory Visit: Payer: Self-pay

## 2020-06-28 ENCOUNTER — Ambulatory Visit
Admission: RE | Admit: 2020-06-28 | Discharge: 2020-06-28 | Disposition: A | Discharge status: Home / Self Care | Payer: BC Managed Care – PPO | Source: Ambulatory Visit | Attending: Advanced Practice Midwife | Admitting: Advanced Practice Midwife

## 2020-06-28 ENCOUNTER — Other Ambulatory Visit: Payer: Self-pay | Admitting: Advanced Practice Midwife

## 2020-06-28 DIAGNOSIS — R928 Other abnormal and inconclusive findings on diagnostic imaging of breast: Secondary | ICD-10-CM

## 2020-06-28 DIAGNOSIS — N6325 Unspecified lump in the left breast, overlapping quadrants: Secondary | ICD-10-CM

## 2020-06-28 DIAGNOSIS — N6310 Unspecified lump in the right breast, unspecified quadrant: Secondary | ICD-10-CM

## 2020-06-28 DIAGNOSIS — Z01419 Encounter for gynecological examination (general) (routine) without abnormal findings: Secondary | ICD-10-CM

## 2020-06-28 DIAGNOSIS — N6320 Unspecified lump in the left breast, unspecified quadrant: Secondary | ICD-10-CM

## 2020-07-05 NOTE — Progress Notes (Signed)
Biopsy/clip orders signed.

## 2020-07-06 ENCOUNTER — Ambulatory Visit
Admission: RE | Admit: 2020-07-06 | Discharge: 2020-07-06 | Disposition: A | Discharge status: Home / Self Care | Payer: BC Managed Care – PPO | Source: Ambulatory Visit | Attending: Advanced Practice Midwife | Admitting: Advanced Practice Midwife

## 2020-07-06 ENCOUNTER — Other Ambulatory Visit: Payer: Self-pay

## 2020-07-06 DIAGNOSIS — N6320 Unspecified lump in the left breast, unspecified quadrant: Secondary | ICD-10-CM | POA: Diagnosis present

## 2020-07-06 DIAGNOSIS — R928 Other abnormal and inconclusive findings on diagnostic imaging of breast: Secondary | ICD-10-CM | POA: Insufficient documentation

## 2020-07-07 LAB — SURGICAL PATHOLOGY

## 2020-07-16 ENCOUNTER — Ambulatory Visit: Payer: Self-pay

## 2020-07-19 ENCOUNTER — Other Ambulatory Visit: Payer: Self-pay

## 2020-07-19 ENCOUNTER — Ambulatory Visit
Admission: RE | Admit: 2020-07-19 | Discharge: 2020-07-19 | Disposition: A | Discharge status: Home / Self Care | Payer: BC Managed Care – PPO | Source: Ambulatory Visit | Attending: Advanced Practice Midwife | Admitting: Advanced Practice Midwife

## 2020-07-19 DIAGNOSIS — Z01419 Encounter for gynecological examination (general) (routine) without abnormal findings: Secondary | ICD-10-CM | POA: Diagnosis present

## 2020-07-19 DIAGNOSIS — R102 Pelvic and perineal pain: Secondary | ICD-10-CM | POA: Diagnosis present

## 2020-07-20 ENCOUNTER — Encounter: Payer: Self-pay | Admitting: Advanced Practice Midwife

## 2020-07-20 ENCOUNTER — Ambulatory Visit (INDEPENDENT_AMBULATORY_CARE_PROVIDER_SITE_OTHER): Payer: BC Managed Care – PPO | Admitting: Advanced Practice Midwife

## 2020-07-20 DIAGNOSIS — Z09 Encounter for follow-up examination after completed treatment for conditions other than malignant neoplasm: Secondary | ICD-10-CM | POA: Diagnosis not present

## 2020-07-20 DIAGNOSIS — N83201 Unspecified ovarian cyst, right side: Secondary | ICD-10-CM

## 2020-07-20 NOTE — Progress Notes (Addendum)
Patient ID: Jodi Wright, female   DOB: 17-Jul-1980, 40 y.o.   MRN: [id number]  Reason for Consult: Follow-up (Phone visit - F/U TVUS - still having Lt leg pain what are her options?)    Subjective:  HPI:  Jodi Wright is a 40 y.o. female telephone follow up visit for gyn ultrasound- pelvic pain.   I connected with Jodi Wright on 07/20/2020 at  4:40 PM EDT by telephone and verified that I am speaking with the correct person using two identifiers.   I discussed the limitations, risks, security and privacy concerns of performing an evaluation and management service by telephone and the availability of in person appointments. I also discussed with the patient that there may be a patient responsible charge related to this service. The patient expressed understanding and agreed to proceed.  The patient was at home I spoke with the patient from my  Office phone The names of people involved in this encounter were: [name] and myself Rod Can, CNM   Ultrasound done yesterday at [facility] is remarkable for mildly enlarged uterus and 2 small fibroids, also 2 cm right ovarian cyst with daughter cyst. Recommendation is to have repeat scan in 2-3 months.  She has question regarding left leg pain- likely sciatica. Discussed various comfort measures and treatments.    Past Medical History:  Diagnosis Date  . Chicken pox   . Seasonal allergies    Family History  Problem Relation Age of Onset  . Arthritis Mother   . Cancer Mother        colon  . Hyperlipidemia Mother   . Mental illness Mother   . Hyperlipidemia Father   . Stroke Father   . Hypertension Father   . Arthritis Maternal Aunt   . Cancer Maternal Aunt        colon  . Arthritis Maternal Grandmother   . Cancer Maternal Grandfather        lung  . Diabetes Paternal Grandmother   . Alcohol abuse Paternal Grandfather    Past Surgical History:  Procedure Laterality Date  . BREAST BIOPSY Right 2003   extra breast  tissue in right breast under arm    Short Social History:  Social History   Tobacco Use  . Smoking status: Never Smoker  . Smokeless tobacco: Never Used  Substance Use Topics  . Alcohol use: Yes    Alcohol/week: 0.0 - 1.0 standard drinks    Comment: occ    Allergies  Allergen Reactions  . Sulfa Antibiotics Nausea And Vomiting  . Ciprofloxacin Rash  . Mucinex Dm [Dm-Guaifenesin Er] Hypertension    Felt dizzy    Current Outpatient Medications  Medication Sig Dispense Refill  . fexofenadine (ALLEGRA) 180 MG tablet Take 180 mg by mouth daily.    . cetirizine (ZYRTEC) 10 MG tablet Take 10 mg by mouth daily. (Patient not taking: Reported on 07/20/2020)     No current facility-administered medications for this visit.   Review of Systems  Constitutional: Negative for chills and fever.  HENT: Negative for congestion, ear discharge, ear pain, hearing loss, sinus pain and sore throat.   Eyes: Negative for blurred vision and double vision.  Respiratory: Negative for cough, shortness of breath and wheezing.   Cardiovascular: Negative for chest pain, palpitations and leg swelling.  Gastrointestinal: Negative for abdominal pain, blood in stool, constipation, diarrhea, heartburn, melena, nausea and vomiting.  Genitourinary: Negative for dysuria, flank pain, frequency, hematuria and urgency.  Musculoskeletal: Positive for back pain. Negative  for joint pain and myalgias.       Positive for left leg pain  Skin: Negative for itching and rash.  Neurological: Negative for dizziness, tingling, tremors, sensory change, speech change, focal weakness, seizures, loss of consciousness, weakness and headaches.  Endo/Heme/Allergies: Negative for environmental allergies. Does not bruise/bleed easily.  Psychiatric/Behavioral: Negative for depression, hallucinations, memory loss, substance abuse and suicidal ideas. The patient is not nervous/anxious and does not have insomnia.          Objective:   Objective   There were no vitals filed for this visit. There is no height or weight on file to calculate BMI.  No physical exam- telephone visit   Data:  CLINICAL DATA:  40 year old female with pelvic pain for 8 months. LMP 07/11/2020.  EXAM: TRANSABDOMINAL AND TRANSVAGINAL ULTRASOUND OF PELVIS  TECHNIQUE: Both transabdominal and transvaginal ultrasound examinations of the pelvis were performed. Transabdominal technique was performed for global imaging of the pelvis including uterus, ovaries, adnexal regions, and pelvic cul-de-sac. It was necessary to proceed with endovaginal exam following the transabdominal exam to visualize the endometrium, myometrium and adnexa.  COMPARISON:  11/16/2016 unenhanced CT abdomen/pelvis.  FINDINGS: Uterus  Measurements: 9.8 x 4.9 x 6.5 cm = volume: 165 mL. Mildly enlarged anteverted myomatous uterus with fibroids as follows:  -right anterior fundal intramural 1.7 x 1.6 x 1.7 cm fibroid  -posterior uterine body intramural 1.7 x 1.2 x 1.7 cm fibroid  Endometrium  Thickness: 7 mm. No endometrial cavity fluid or focal endometrial mass.  Right ovary  Measurements: 3.2 x 2.5 x 2.7 cm = volume: 11.2 mL. Right ovarian 2.2 x 2.1 x 2.0 cm cyst with eccentric thin-walled daughter cyst. No suspicious masses.  Left ovary  Measurements: 2.7 x 1.6 x 1.8 cm = volume: 4.0 mL. Normal appearance/no adnexal mass.  Other findings  No abnormal free fluid.  IMPRESSION: 1. Mildly enlarged anteverted myomatous uterus as detailed. 2. Right ovarian 2.2 cm cyst with eccentric thin walled daughter cyst, most likely a benign physiologic cyst. Suggest attention on follow-up transvaginal pelvic ultrasound in 8-12 weeks.   Electronically Signed   By: Ilona Sorrel M.D.   On: 07/20/2020 09:27  A total of 10 minutes was spent on telephone visit reviewing results of gyn ultrasound and answering questions regarding back/leg  pain.  Assessment/Plan:     40 y.o. G2 P86 female with 2 small uterine fibroids, mildly enlarged uterus, right ovarian cyst with eccentric thin-walled daughter cyst  Repeat gyn u/s in 3 months Follow up with MD after ultrasound regarding treatment recommendations   [facility] 07/20/2020, 4:56 PM

## 2020-07-26 ENCOUNTER — Other Ambulatory Visit: Payer: BC Managed Care – PPO

## 2020-07-26 ENCOUNTER — Ambulatory Visit: Payer: BC Managed Care – PPO | Admitting: Obstetrics and Gynecology

## 2020-10-22 ENCOUNTER — Other Ambulatory Visit: Payer: Self-pay

## 2020-10-22 ENCOUNTER — Ambulatory Visit
Admission: RE | Admit: 2020-10-22 | Discharge: 2020-10-22 | Disposition: A | Discharge status: Home / Self Care | Payer: BC Managed Care – PPO | Source: Ambulatory Visit | Attending: Advanced Practice Midwife | Admitting: Advanced Practice Midwife

## 2020-10-22 DIAGNOSIS — N83201 Unspecified ovarian cyst, right side: Secondary | ICD-10-CM | POA: Diagnosis not present

## 2020-10-28 ENCOUNTER — Other Ambulatory Visit: Payer: Self-pay

## 2020-10-28 ENCOUNTER — Encounter: Payer: Self-pay | Admitting: Advanced Practice Midwife

## 2020-10-28 ENCOUNTER — Ambulatory Visit (INDEPENDENT_AMBULATORY_CARE_PROVIDER_SITE_OTHER): Payer: BC Managed Care – PPO | Admitting: Advanced Practice Midwife

## 2020-10-28 DIAGNOSIS — Z09 Encounter for follow-up examination after completed treatment for conditions other than malignant neoplasm: Secondary | ICD-10-CM | POA: Diagnosis not present

## 2020-10-28 NOTE — Progress Notes (Signed)
Patient ID: Jodi Wright, female   DOB: 1980/04/17, 40 y.o.   MRN: [id number]  Reason for Consult: Follow-up (U/S)    Subjective:  HPI:  Virtual Visit via Telephone Note  I connected with Jodi Wright on 10/28/20 at  3:45 PM EDT by telephone and verified that I am speaking with the correct person using two identifiers.   I discussed the limitations, risks, security and privacy concerns of performing an evaluation and management service by telephone and the availability of in person appointments. I also discussed with the patient that there may be a patient responsible charge related to this service. The patient expressed understanding and agreed to proceed.  The patient was at home I spoke with the patient from my  office phone The names of people involved in this encounter were: Jodi Wright and myself Rod Can, CNM  ----------------------------------------------------------------------------------- The following portions of the patient's history were reviewed and updated as appropriate: allergies, current medications, past family history, past medical history, past social history, past surgical history and problem list. Problem list updated.   I discussed the assessment and treatment plan with the patient. The patient was provided an opportunity to ask questions and all were answered. The patient agreed with the plan and demonstrated an understanding of the instructions.   The patient was advised to call back or seek an in-person evaluation if the symptoms worsen or if the condition fails to improve as anticipated.  I provided 10 minutes of non-face-to-face time during this encounter.   Jodi Wright is a 40 y.o. female having follow up visit for recent gyn ultrasound. She had gyn ultrasound in April of this year with finding of right ovarian cyst and recommendation for repeat ultrasound in 8-12 weeks. Ultrasound done last week shows resolution of right ovarian cyst.  She reports an episode of severe right side pain for 1 day about a week before the ultrasound. She took OTC pain medicine and used a heating pad with minimal relief during that time. She denies any concerns at this time. We reviewed the findings of the ultrasound.   Past Medical History:  Diagnosis Date   Chicken pox    Seasonal allergies    Family History  Problem Relation Age of Onset   Arthritis Mother    Cancer Mother        colon   Hyperlipidemia Mother    Mental illness Mother    Hyperlipidemia Father    Stroke Father    Hypertension Father    Arthritis Maternal Aunt    Cancer Maternal Aunt        colon   Arthritis Maternal Grandmother    Cancer Maternal Grandfather        lung   Diabetes Paternal Grandmother    Alcohol abuse Paternal Grandfather    Past Surgical History:  Procedure Laterality Date   BREAST BIOPSY Right 2003   extra breast tissue in right breast under arm    Short Social History:  Social History   Tobacco Use   Smoking status: Never   Smokeless tobacco: Never  Substance Use Topics   Alcohol use: Yes    Alcohol/week: 0.0 - 1.0 standard drinks    Comment: occ    Allergies  Allergen Reactions   Sulfa Antibiotics Nausea And Vomiting   Ciprofloxacin Rash   Mucinex Dm [Dm-Guaifenesin Er] Hypertension    Felt dizzy    Current Outpatient Medications  Medication Sig Dispense Refill   fexofenadine (ALLEGRA) 180 MG tablet Take  180 mg by mouth daily.     No current facility-administered medications for this visit.   Review of Systems  Constitutional:  Negative for chills and fever.  HENT:  Negative for congestion, ear discharge, ear pain, hearing loss, sinus pain and sore throat.   Eyes:  Negative for blurred vision and double vision.  Respiratory:  Negative for cough, shortness of breath and wheezing.   Cardiovascular:  Negative for chest pain, palpitations and leg swelling.  Gastrointestinal:  Negative for abdominal pain, blood in stool,  constipation, diarrhea, heartburn, melena, nausea and vomiting.  Genitourinary:  Negative for dysuria, flank pain, frequency, hematuria and urgency.  Musculoskeletal:  Negative for back pain, joint pain and myalgias.  Skin:  Negative for itching and rash.  Neurological:  Negative for dizziness, tingling, tremors, sensory change, speech change, focal weakness, seizures, loss of consciousness, weakness and headaches.  Endo/Heme/Allergies:  Negative for environmental allergies. Does not bruise/bleed easily.  Psychiatric/Behavioral:  Negative for depression, hallucinations, memory loss, substance abuse and suicidal ideas. The patient is not nervous/anxious and does not have insomnia.        Objective:  Objective   There were no vitals filed for this visit. There is no height or weight on file to calculate BMI.   Data: CLINICAL DATA:  Follow-up right ovarian cyst.   EXAM: TRANSABDOMINAL AND TRANSVAGINAL ULTRASOUND OF PELVIS   TECHNIQUE: Both transabdominal and transvaginal ultrasound examinations of the pelvis were performed. Transabdominal technique was performed for global imaging of the pelvis including uterus, ovaries, adnexal regions, and pelvic cul-de-sac. It was necessary to proceed with endovaginal exam following the transabdominal exam to visualize the uterus, endometrium, and ovaries.   COMPARISON:  Ultrasound from 07/19/2020.   FINDINGS: Uterus   Measurements: 8.4 x 6.1 x 5.7 cm = volume: 151 mL. Uterus is anteverted. 1.5 x 1.4 x 1.4 cm subserosal fibroid seen at the right uterine fundus. 1.9 x 1.6 x 1.9 cm intramural fibroid present at the left uterine body.   Endometrium   Thickness: 8 mm.  No focal abnormality visualized.   Right ovary   Measurements: 2.0 x 1.0 x 1.4 cm = volume: 1.6 mL. Normal appearance/no adnexal mass. Previously seen right ovarian cyst has resolved and is no longer seen.   Left ovary   Measurements: 3.1 x 2.4 x 2.4 cm = volume: 9.3  mL. Normal appearance/no adnexal mass.   Other findings   Trace free fluid seen within the pelvis.   IMPRESSION: 1. Interval resolution of previously seen right ovarian cyst. No adnexal mass on today's exam. 2. Mildly enlarged fibroid uterus as above.     Electronically Signed   By: Jeannine Boga M.D.   On: 10/22/2020 21:13    Assessment/Plan:     40 y.o. G2 P2 female gyn ultrasound follow up  Return to clinic PRN and for annual exam     [facility] 10/28/2020, 4:09 PM

## 2021-08-25 ENCOUNTER — Telehealth: Payer: Self-pay

## 2021-08-25 NOTE — Telephone Encounter (Signed)
Left vm to confirm 09/01/21 appointment-Toni

## 2021-09-01 ENCOUNTER — Ambulatory Visit (INDEPENDENT_AMBULATORY_CARE_PROVIDER_SITE_OTHER): Payer: BC Managed Care – PPO | Admitting: Physician Assistant

## 2021-09-01 ENCOUNTER — Encounter: Payer: Self-pay | Admitting: Physician Assistant

## 2021-09-01 DIAGNOSIS — Z1231 Encounter for screening mammogram for malignant neoplasm of breast: Secondary | ICD-10-CM | POA: Diagnosis not present

## 2021-09-01 DIAGNOSIS — Z23 Encounter for immunization: Secondary | ICD-10-CM

## 2021-09-01 DIAGNOSIS — R7989 Other specified abnormal findings of blood chemistry: Secondary | ICD-10-CM | POA: Diagnosis not present

## 2021-09-01 DIAGNOSIS — E782 Mixed hyperlipidemia: Secondary | ICD-10-CM

## 2021-09-01 DIAGNOSIS — R5383 Other fatigue: Secondary | ICD-10-CM | POA: Diagnosis not present

## 2021-09-01 DIAGNOSIS — Z7689 Persons encountering health services in other specified circumstances: Secondary | ICD-10-CM

## 2021-09-01 MED ORDER — TETANUS-DIPHTH-ACELL PERTUSSIS 5-2.5-18.5 LF-MCG/0.5 IM SUSY: 0.5000 mL | PREFILLED_SYRINGE | Freq: Once | INTRAMUSCULAR | 0 refills | Status: AC

## 2021-09-01 NOTE — Progress Notes (Cosign Needed)
[facility] [address], [place] [address]  Internal MEDICINE  Office Visit Note  Patient Name: Jodi Wright  [id number]  [id number]  Date of Service: 09/13/2021   Complaints/HPI Pt is here for establishment of PCP. Chief Complaint  Patient presents with   New Patient (Initial Visit)   HPI Pt is here to establish care  -Just needs to establish care locally where she can bee seen for routine monitoring as well as in case of any acute needs -perimenopausal per OBGYN, did have breast lump last year and had mammogram and biopsy and everything looked ok. States she is due for routine screening -Also had come cycle abnormalities with right side lower pelvic pain and had pelvic US to check. Found to have possible cyst and then was rechecked 3 months later it was gone and noted some increased pain in between where cyst may have ruptured -Only medications include allergy pill and mutlivitamin -Does have 2 kids, 9yo and 10yo. Lives with husband, kids, and 3 dogs, and bearded dragon. -Works as a Oceanographer.  -never been a smoker, drinks alcohol occasionally, no other substance use.  -Will play outside with kids and walk dogs, will do gardening/mowing -Eats mostly home cooked meals -hx of kidney stone--mobic helped when back pain would occur  Current Medication: Outpatient Encounter Medications as of 09/01/2021  Medication Sig   levocetirizine (XYZAL) 5 MG tablet Take 5 mg by mouth every evening.   Multiple Vitamin (MULTIVITAMIN ADULT PO) Take by mouth.   [DISCONTINUED] Tdap (BOOSTRIX) 5-2.5-18.5 LF-MCG/0.5 injection Inject 0.5 mLs into the muscle once.   [EXPIRED] Tdap (BOOSTRIX) 5-2.5-18.5 LF-MCG/0.5 injection Inject 0.5 mLs into the muscle once for 1 dose.   [DISCONTINUED] fexofenadine (ALLEGRA) 180 MG tablet Take 180 mg by mouth daily. (Patient not taking: Reported on 09/01/2021)   No facility-administered encounter medications on file as of 09/01/2021.     Surgical History: Past Surgical History:  Procedure Laterality Date   BREAST BIOPSY Right 2003   extra breast tissue in right breast under arm    Medical History: Past Medical History:  Diagnosis Date   Chicken pox    Seasonal allergies     Family History: Family History  Problem Relation Age of Onset   Hypertension Mother    Diabetes Mother    COPD Mother    Arthritis Mother    Cancer Mother        colon   Hyperlipidemia Mother    Mental illness Mother    Hyperlipidemia Father    Stroke Father    Hypertension Father    Melanoma Father    Arthritis Maternal Aunt    Cancer Maternal Aunt        colon   Dementia Maternal Aunt    Depression Maternal Aunt    Arthritis Maternal Grandmother    Colon cancer Maternal Grandmother    Cancer Maternal Grandfather        lung   Diabetes Paternal Grandmother    Alcohol abuse Paternal Grandfather     Social History   Socioeconomic History   Marital status: Married    Spouse name: Not on file   Number of children: Not on file   Years of education: Not on file   Highest education level: Not on file  Occupational History   Not on file  Tobacco Use   Smoking status: Never   Smokeless tobacco: Never  Vaping Use   Vaping Use: Never used  Substance and Sexual Activity  Alcohol use: Yes    Alcohol/week: 0.0 - 1.0 standard drinks of alcohol    Comment: occ   Drug use: No   Sexual activity: Yes    Partners: Male    Birth control/protection: None  Other Topics Concern   Not on file  Social History Narrative   Not on file   Social Determinants of Health   Financial Resource Strain: Not on file  Food Insecurity: Not on file  Transportation Needs: Not on file  Physical Activity: Not on file  Stress: Stress Concern Present (05/06/2018)   [place]    Feeling of Stress : To some extent  Social Connections: Not on file  Intimate Partner Violence:  Not on file     Review of Systems  Constitutional:  Negative for chills, fatigue and unexpected weight change.  HENT:  Negative for congestion, postnasal drip, rhinorrhea, sneezing and sore throat.   Eyes:  Negative for redness.  Respiratory:  Negative for cough, chest tightness and shortness of breath.   Cardiovascular:  Negative for chest pain and palpitations.  Gastrointestinal:  Negative for abdominal pain, constipation, diarrhea, nausea and vomiting.  Genitourinary:  Negative for dysuria and frequency.  Musculoskeletal:  Negative for arthralgias, back pain, joint swelling and neck pain.  Skin:  Negative for rash.  Neurological: Negative.  Negative for tremors and numbness.  Hematological:  Negative for adenopathy. Does not bruise/bleed easily.  Psychiatric/Behavioral:  Negative for behavioral problems (Depression), sleep disturbance and suicidal ideas. The patient is not nervous/anxious.     Vital Signs: BP 118/81   Pulse 88   Temp 98.8 F (37.1 C)   Resp 16   Ht '5\' 7"'$  (1.702 m)   Wt 168 lb 3.2 oz (76.3 kg)   SpO2 98%   BMI 26.34 kg/m    Physical Exam Vitals and nursing note reviewed.  Constitutional:      General: She is not in acute distress.    Appearance: Normal appearance. She is well-developed. She is not diaphoretic.  HENT:     Head: Normocephalic and atraumatic.     Mouth/Throat:     Pharynx: No oropharyngeal exudate.  Eyes:     Pupils: Pupils are equal, round, and reactive to light.  Neck:     Thyroid: No thyromegaly.     Vascular: No JVD.     Trachea: No tracheal deviation.  Cardiovascular:     Rate and Rhythm: Normal rate and regular rhythm.     Heart sounds: Normal heart sounds. No murmur heard.    No friction rub. No gallop.  Pulmonary:     Effort: Pulmonary effort is normal. No respiratory distress.     Breath sounds: No wheezing or rales.  Chest:     Chest wall: No tenderness.  Abdominal:     General: Bowel sounds are normal.      Palpations: Abdomen is soft.  Musculoskeletal:        General: Normal range of motion.     Cervical back: Normal range of motion and neck supple.  Lymphadenopathy:     Cervical: No cervical adenopathy.  Skin:    General: Skin is warm and dry.  Neurological:     Mental Status: She is alert and oriented to person, place, and time.     Cranial Nerves: No cranial nerve deficit.  Psychiatric:        Behavior: Behavior normal.        Thought Content: Thought  content normal.        Judgment: Judgment normal.       Assessment/Plan: 1. Visit for screening mammogram Will place order for mammogram - MM 3D SCREEN BREAST BILATERAL; Future  2. Need for Tdap vaccination - Tdap ([place]) 5-2.5-18.5 LF-MCG/0.5 injection; Inject 0.5 mLs into the muscle once for 1 dose.  Dispense: 0.5 mL; Refill: 0  3. Encounter to establish care Reviewed history and will order fasting labs prior to CPE next visit - CBC w/Diff/Platelet - Comprehensive metabolic panel - TSH + free T4 - Lipid Panel With LDL/HDL Ratio  4. Mixed hyperlipidemia - Lipid Panel With LDL/HDL Ratio  5. Abnormal thyroid blood test - TSH + free T4  6. Other fatigue - CBC w/Diff/Platelet - Comprehensive metabolic panel - TSH + free T4 - Lipid Panel With LDL/HDL Ratio   General Counseling: Amirah verbalizes understanding of the findings of todays visit and agrees with plan of treatment. I have discussed any further diagnostic evaluation that may be needed or ordered today. We also reviewed her medications today. she has been encouraged to call the office with any questions or concerns that should arise related to todays visit.    Counseling:    Orders Placed This Encounter  Procedures   MM 3D SCREEN BREAST BILATERAL   CBC w/Diff/Platelet   Comprehensive metabolic panel   TSH + free T4   Lipid Panel With LDL/HDL Ratio    Meds ordered this encounter  Medications   Tdap (BOOSTRIX) 5-2.5-18.5 LF-MCG/0.5 injection     Sig: Inject 0.5 mLs into the muscle once for 1 dose.    Dispense:  0.5 mL    Refill:  0     This patient was seen by Drema Dallas, PA-C in collaboration with Dr. Clayborn Bigness as a part of collaborative care agreement.   Time spent:35 Minutes

## 2021-10-14 ENCOUNTER — Telehealth: Payer: Self-pay

## 2021-10-14 NOTE — Telephone Encounter (Signed)
Left vm and sent mychart message to confirm 10/21/21 appointment-Toni

## 2021-10-21 ENCOUNTER — Ambulatory Visit (INDEPENDENT_AMBULATORY_CARE_PROVIDER_SITE_OTHER): Payer: BC Managed Care – PPO | Admitting: Physician Assistant

## 2021-10-21 ENCOUNTER — Encounter: Payer: Self-pay | Admitting: Physician Assistant

## 2021-10-21 VITALS — BP 118/73 | HR 77 | Temp 97.7°F | Resp 16 | Ht 67.0 in | Wt 169.2 lb

## 2021-10-21 DIAGNOSIS — Z0001 Encounter for general adult medical examination with abnormal findings: Secondary | ICD-10-CM

## 2021-10-21 DIAGNOSIS — R300 Dysuria: Secondary | ICD-10-CM

## 2021-10-21 NOTE — Progress Notes (Signed)
[facility] [place], [place] [address]  Internal MEDICINE  Office Visit Note  Patient Name: Jodi Wright  [id number]  [id number]  Date of Service: 11/02/2021  Chief Complaint  Patient presents with   Annual Exam     HPI Pt is here for routine health maintenance examination and has no complaints today -Will schedule mammogram -also needs to get fasting blood work done still -has had a busy summer -Did have some left sided SI pain for 1.5 days but stretched and went away. -sleeping well -she is UTD on pap  Current Medication: Outpatient Encounter Medications as of 10/21/2021  Medication Sig   levocetirizine (XYZAL) 5 MG tablet Take 5 mg by mouth every evening.   Multiple Vitamin (MULTIVITAMIN ADULT PO) Take by mouth.   No facility-administered encounter medications on file as of 10/21/2021.    Surgical History: Past Surgical History:  Procedure Laterality Date   BREAST BIOPSY Right 2003   extra breast tissue in right breast under arm    Medical History: Past Medical History:  Diagnosis Date   Chicken pox    Seasonal allergies     Family History: Family History  Problem Relation Age of Onset   Hypertension Mother    Diabetes Mother    COPD Mother    Arthritis Mother    Cancer Mother        colon   Hyperlipidemia Mother    Mental illness Mother    Hyperlipidemia Father    Stroke Father    Hypertension Father    Melanoma Father    Arthritis Maternal Aunt    Cancer Maternal Aunt        colon   Dementia Maternal Aunt    Depression Maternal Aunt    Arthritis Maternal Grandmother    Colon cancer Maternal Grandmother    Cancer Maternal Grandfather        lung   Diabetes Paternal Grandmother    Alcohol abuse Paternal Grandfather       Review of Systems  Constitutional:  Negative for chills, fatigue and unexpected weight change.  HENT:  Negative for congestion, postnasal drip, rhinorrhea, sneezing and sore throat.   Eyes:   Negative for redness.  Respiratory:  Negative for cough, chest tightness and shortness of breath.   Cardiovascular:  Negative for chest pain and palpitations.  Gastrointestinal:  Negative for abdominal pain, constipation, diarrhea, nausea and vomiting.  Genitourinary:  Negative for dysuria and frequency.  Musculoskeletal:  Negative for arthralgias, back pain, joint swelling and neck pain.  Skin:  Negative for rash.  Neurological: Negative.  Negative for tremors and numbness.  Hematological:  Negative for adenopathy. Does not bruise/bleed easily.  Psychiatric/Behavioral:  Negative for behavioral problems (Depression), sleep disturbance and suicidal ideas. The patient is not nervous/anxious.      Vital Signs: BP 118/73   Pulse 77   Temp 97.7 F (36.5 C)   Resp 16   Ht _0  (1.702 m)   Wt 169 lb 3.2 oz (76.7 kg)   SpO2 98%   BMI 26.50 kg/m    Physical Exam Vitals and nursing note reviewed.  Constitutional:      General: She is not in acute distress.    Appearance: Normal appearance. She is well-developed. She is not diaphoretic.  HENT:     Head: Normocephalic and atraumatic.     Mouth/Throat:     Pharynx: No oropharyngeal exudate.  Eyes:     Pupils: Pupils are equal, round, and  reactive to light.  Neck:     Thyroid: No thyromegaly.     Vascular: No JVD.     Trachea: No tracheal deviation.  Cardiovascular:     Rate and Rhythm: Normal rate and regular rhythm.     Heart sounds: Normal heart sounds. No murmur heard.    No friction rub. No gallop.  Pulmonary:     Effort: Pulmonary effort is normal. No respiratory distress.     Breath sounds: No wheezing or rales.  Chest:     Chest wall: No tenderness.  Abdominal:     General: Bowel sounds are normal.     Palpations: Abdomen is soft.     Tenderness: There is no abdominal tenderness.  Musculoskeletal:        General: Normal range of motion.     Cervical back: Normal range of motion and neck supple.  Lymphadenopathy:      Cervical: No cervical adenopathy.  Skin:    General: Skin is warm and dry.  Neurological:     Mental Status: She is alert and oriented to person, place, and time.     Cranial Nerves: No cranial nerve deficit.  Psychiatric:        Behavior: Behavior normal.        Thought Content: Thought content normal.        Judgment: Judgment normal.      LABS: Recent Results (from the past 2160 hour(s))  UA/M w/rflx Culture, Routine     Status: Abnormal   Collection Time: 10/21/21 11:46 AM   Specimen: Urine   Urine  Result Value Ref Range   Specific Gravity, UA 1.023 1.005 - 1.030   pH, UA 5.5 5.0 - 7.5   Color, UA Yellow Yellow   Appearance Ur Turbid (A) Clear   Leukocytes,UA Negative Negative   Protein,UA Negative Negative/Trace   Glucose, UA Negative Negative   Ketones, UA Negative Negative   RBC, UA Trace (A) Negative   Bilirubin, UA Negative Negative   Urobilinogen, Ur 0.2 0.2 - 1.0 mg/dL   Nitrite, UA Negative Negative   Microscopic Examination See below:     Comment: Microscopic was indicated and was performed.   Urinalysis Reflex Comment     Comment: This specimen will not reflex to a Urine Culture.  Microscopic Examination     Status: None   Collection Time: 10/21/21 11:46 AM   Urine  Result Value Ref Range   WBC, UA None seen 0 - 5 /hpf   RBC, Urine None seen 0 - 2 /hpf   Epithelial Cells (non renal) None seen 0 - 10 /hpf   Casts None seen None seen /lpf   Bacteria, UA None seen None seen/Few  CBC w/Diff/Platelet     Status: None   Collection Time: 10/26/21 10:09 AM  Result Value Ref Range   WBC 6.9 3.4 - 10.8 x10E3/uL   RBC 4.79 3.77 - 5.28 x10E6/uL   Hemoglobin 13.8 11.1 - 15.9 g/dL   Hematocrit 42.2 34.0 - 46.6 %   MCV 88 79 - 97 fL   MCH 28.8 26.6 - 33.0 pg   MCHC 32.7 31.5 - 35.7 g/dL   RDW 11.8 11.7 - 15.4 %   Platelets 313 150 - 450 x10E3/uL   Neutrophils 54 Not Estab. %   Lymphs 32 Not Estab. %   Monocytes 7 Not Estab. %   Eos 6 Not Estab. %    Basos 1 Not Estab. %   Neutrophils Absolute 3.7  1.4 - 7.0 x10E3/uL   Lymphocytes Absolute 2.2 0.7 - 3.1 x10E3/uL   Monocytes Absolute 0.5 0.1 - 0.9 x10E3/uL   EOS (ABSOLUTE) 0.4 0.0 - 0.4 x10E3/uL   Basophils Absolute 0.1 0.0 - 0.2 x10E3/uL   Immature Granulocytes 0 Not Estab. %   Immature Grans (Abs) 0.0 0.0 - 0.1 x10E3/uL  Comprehensive metabolic panel     Status: Abnormal   Collection Time: 10/26/21 10:09 AM  Result Value Ref Range   Glucose 99 70 - 99 mg/dL   BUN 13 6 - 24 mg/dL   Creatinine, Ser 0.90 0.57 - 1.00 mg/dL   eGFR 81 >59 mL/min/1.73   BUN/Creatinine Ratio 14 9 - 23   Sodium 139 134 - 144 mmol/L   Potassium 4.3 3.5 - 5.2 mmol/L   Chloride 102 96 - 106 mmol/L   CO2 22 20 - 29 mmol/L   Calcium 9.7 8.7 - 10.2 mg/dL   Total Protein 6.4 6.0 - 8.5 g/dL   Albumin 4.5 3.9 - 4.9 g/dL   Globulin, Total 1.9 1.5 - 4.5 g/dL   Albumin/Globulin Ratio 2.4 (H) 1.2 - 2.2   Bilirubin Total 0.4 0.0 - 1.2 mg/dL   Alkaline Phosphatase 54 44 - 121 IU/L   AST 12 0 - 40 IU/L   ALT 16 0 - 32 IU/L  TSH + free T4     Status: None   Collection Time: 10/26/21 10:09 AM  Result Value Ref Range   TSH 2.270 0.450 - 4.500 uIU/mL   Free T4 1.17 0.82 - 1.77 ng/dL  Lipid Panel With LDL/HDL Ratio     Status: Abnormal   Collection Time: 10/26/21 10:09 AM  Result Value Ref Range   Cholesterol, Total 208 (H) 100 - 199 mg/dL   Triglycerides 95 0 - 149 mg/dL   HDL 57 >39 mg/dL   VLDL Cholesterol Cal 17 5 - 40 mg/dL   LDL Chol Calc (NIH) 134 (H) 0 - 99 mg/dL   LDL/HDL Ratio 2.4 0.0 - 3.2 ratio    Comment:                                     LDL/HDL Ratio                                             Men  Women                               1/2 Avg.Risk  1.0    1.5                                   Avg.Risk  3.6    3.2                                2X Avg.Risk  6.2    5.0                                3X Avg.Risk  8.0    6.1  Assessment/Plan: 1. Encounter for general adult medical  examination with abnormal findings CPE performed, mammogram will be scheduled, will go for lab work that was previously ordered  2. Dysuria - UA/M w/rflx Culture, Routine   General Counseling: Saleemah verbalizes understanding of the findings of todays visit and agrees with plan of treatment. I have discussed any further diagnostic evaluation that may be needed or ordered today. We also reviewed her medications today. she has been encouraged to call the office with any questions or concerns that should arise related to todays visit.    Counseling:    Orders Placed This Encounter  Procedures   Microscopic Examination   UA/M w/rflx Culture, Routine    No orders of the defined types were placed in this encounter.   This patient was seen by Drema Dallas, PA-C in collaboration with Dr. Clayborn Bigness as a part of collaborative care agreement.  Total time spent:30 Minutes  Time spent includes review of chart, medications, test results, and follow up plan with the patient.     Lavera Guise, MD  Internal Medicine

## 2021-10-22 LAB — MICROSCOPIC EXAMINATION
Bacteria, UA: NONE SEEN
Casts: NONE SEEN /lpf
Epithelial Cells (non renal): NONE SEEN /hpf (ref 0–10)
RBC, Urine: NONE SEEN /hpf (ref 0–2)
WBC, UA: NONE SEEN /hpf (ref 0–5)

## 2021-10-22 LAB — UA/M W/RFLX CULTURE, ROUTINE
Bilirubin, UA: NEGATIVE
Glucose, UA: NEGATIVE
Ketones, UA: NEGATIVE
Leukocytes,UA: NEGATIVE
Nitrite, UA: NEGATIVE
Protein,UA: NEGATIVE
Specific Gravity, UA: 1.023 (ref 1.005–1.030)
Urobilinogen, Ur: 0.2 mg/dL (ref 0.2–1.0)
pH, UA: 5.5 (ref 5.0–7.5)

## 2021-10-27 LAB — CBC WITH DIFFERENTIAL/PLATELET
Basophils Absolute: 0.1 10*3/uL (ref 0.0–0.2)
Basos: 1 %
EOS (ABSOLUTE): 0.4 10*3/uL (ref 0.0–0.4)
Eos: 6 %
Hematocrit: 42.2 % (ref 34.0–46.6)
Hemoglobin: 13.8 g/dL (ref 11.1–15.9)
Immature Grans (Abs): 0 10*3/uL (ref 0.0–0.1)
Immature Granulocytes: 0 %
Lymphocytes Absolute: 2.2 10*3/uL (ref 0.7–3.1)
Lymphs: 32 %
MCH: 28.8 pg (ref 26.6–33.0)
MCHC: 32.7 g/dL (ref 31.5–35.7)
MCV: 88 fL (ref 79–97)
Monocytes Absolute: 0.5 10*3/uL (ref 0.1–0.9)
Monocytes: 7 %
Neutrophils Absolute: 3.7 10*3/uL (ref 1.4–7.0)
Neutrophils: 54 %
Platelets: 313 10*3/uL (ref 150–450)
RBC: 4.79 x10E6/uL (ref 3.77–5.28)
RDW: 11.8 % (ref 11.7–15.4)
WBC: 6.9 10*3/uL (ref 3.4–10.8)

## 2021-10-27 LAB — COMPREHENSIVE METABOLIC PANEL
ALT: 16 IU/L (ref 0–32)
AST: 12 IU/L (ref 0–40)
Albumin/Globulin Ratio: 2.4 — ABNORMAL HIGH (ref 1.2–2.2)
Albumin: 4.5 g/dL (ref 3.9–4.9)
Alkaline Phosphatase: 54 IU/L (ref 44–121)
BUN/Creatinine Ratio: 14 (ref 9–23)
BUN: 13 mg/dL (ref 6–24)
Bilirubin Total: 0.4 mg/dL (ref 0.0–1.2)
CO2: 22 mmol/L (ref 20–29)
Calcium: 9.7 mg/dL (ref 8.7–10.2)
Chloride: 102 mmol/L (ref 96–106)
Creatinine, Ser: 0.9 mg/dL (ref 0.57–1.00)
Globulin, Total: 1.9 g/dL (ref 1.5–4.5)
Glucose: 99 mg/dL (ref 70–99)
Potassium: 4.3 mmol/L (ref 3.5–5.2)
Sodium: 139 mmol/L (ref 134–144)
Total Protein: 6.4 g/dL (ref 6.0–8.5)
eGFR: 81 mL/min/{1.73_m2} (ref >59)

## 2021-10-27 LAB — LIPID PANEL WITH LDL/HDL RATIO
Cholesterol, Total: 208 mg/dL — ABNORMAL HIGH (ref 100–199)
HDL: 57 mg/dL (ref >39)
LDL Chol Calc (NIH): 134 mg/dL — ABNORMAL HIGH (ref 0–99)
LDL/HDL Ratio: 2.4 ratio (ref 0.0–3.2)
Triglycerides: 95 mg/dL (ref 0–149)
VLDL Cholesterol Cal: 17 mg/dL (ref 5–40)

## 2021-10-27 LAB — TSH+FREE T4
Free T4: 1.17 ng/dL (ref 0.82–1.77)
TSH: 2.27 u[IU]/mL (ref 0.450–4.500)

## 2021-10-31 ENCOUNTER — Telehealth: Payer: Self-pay

## 2021-10-31 NOTE — Telephone Encounter (Signed)
-----   Message from Mylinda Latina, PA-C sent at 10/31/2021 11:26 AM EDT ----- Please let her know that her labs overall look good, but her cholesterol is elevated. She should continue to work on diet and exercise and avoid fried fatty foods. May also try fish oil supplement

## 2021-10-31 NOTE — Telephone Encounter (Signed)
Spoke with patient regarding lab results. 

## 2021-11-11 ENCOUNTER — Ambulatory Visit (INDEPENDENT_AMBULATORY_CARE_PROVIDER_SITE_OTHER): Payer: BC Managed Care – PPO | Admitting: Physician Assistant

## 2021-11-11 ENCOUNTER — Encounter: Payer: Self-pay | Admitting: Physician Assistant

## 2021-11-11 VITALS — BP 110/79 | HR 81 | Temp 97.8°F | Resp 16 | Ht 67.0 in | Wt 169.0 lb

## 2021-11-11 DIAGNOSIS — D242 Benign neoplasm of left breast: Secondary | ICD-10-CM | POA: Diagnosis not present

## 2021-11-11 DIAGNOSIS — R300 Dysuria: Secondary | ICD-10-CM | POA: Diagnosis not present

## 2021-11-11 DIAGNOSIS — Z1231 Encounter for screening mammogram for malignant neoplasm of breast: Secondary | ICD-10-CM | POA: Diagnosis not present

## 2021-11-11 LAB — POCT URINALYSIS DIPSTICK
Blood, UA: NEGATIVE
Glucose, UA: NEGATIVE
Leukocytes, UA: NEGATIVE
Nitrite, UA: NEGATIVE
Protein, UA: NEGATIVE
Spec Grav, UA: 1.01 (ref 1.010–1.025)
Urobilinogen, UA: 0.2 E.U./dL
pH, UA: 7 (ref 5.0–8.0)

## 2021-11-11 NOTE — Progress Notes (Signed)
[facility] [place], [place] [address]  Internal MEDICINE  Office Visit Note  Patient Name: Jodi Wright  [id number]  [id number]  Date of Service: 11/16/2021  Chief Complaint  Patient presents with   Acute Visit    Radene Knee in left breast     Urinary Tract Infection    HPI Pt is here for acute visit due to change in left breast -patient has a hx of abnormal mammogram requiring biopsy of left breast that found fibroadenoma. This was in April 2022 -She had her CPE a few weeks ago and was not experiencing any changes and appeared consistent with previous fibroadenoma finding therefore a screening mammogram was ordered -Very recently she did notice some changes and feels the palpable area has gotten bigger/has something else adjacent to it now. Upon stating this when scheduling her mammogram she was advised to be seen in office for evaluation and adjustment of orders if needed -Area does appear to be larger and isnt symmetrically round on palpation therefore will go ahead and change to diagnostic with [place] of left breast. Of note patient is starting cycle and may be having some breast changes related to cycle, but to be safe will evaluate further -Is also having some urinary frequency and would like to check urine  Current Medication: Outpatient Encounter Medications as of 11/11/2021  Medication Sig   levocetirizine (XYZAL) 5 MG tablet Take 5 mg by mouth every evening.   Multiple Vitamin (MULTIVITAMIN ADULT PO) Take by mouth.   No facility-administered encounter medications on file as of 11/11/2021.    Surgical History: Past Surgical History:  Procedure Laterality Date   BREAST BIOPSY Right 2003   extra breast tissue in right breast under arm    Medical History: Past Medical History:  Diagnosis Date   Chicken pox    Seasonal allergies     Family History: Family History  Problem Relation Age of Onset   Hypertension Mother    Diabetes Mother    COPD  Mother    Arthritis Mother    Cancer Mother        colon   Hyperlipidemia Mother    Mental illness Mother    Hyperlipidemia Father    Stroke Father    Hypertension Father    Melanoma Father    Arthritis Maternal Aunt    Cancer Maternal Aunt        colon   Dementia Maternal Aunt    Depression Maternal Aunt    Arthritis Maternal Grandmother    Colon cancer Maternal Grandmother    Cancer Maternal Grandfather        lung   Diabetes Paternal Grandmother    Alcohol abuse Paternal Grandfather     Social History   Socioeconomic History   Marital status: Married    Spouse name: Not on file   Number of children: Not on file   Years of education: Not on file   Highest education level: Not on file  Occupational History   Not on file  Tobacco Use   Smoking status: Never   Smokeless tobacco: Never  Vaping Use   Vaping Use: Never used  Substance and Sexual Activity   Alcohol use: Yes    Alcohol/week: 0.0 - 1.0 standard drinks of alcohol    Comment: occ   Drug use: No   Sexual activity: Yes    Partners: Male    Birth control/protection: None  Other Topics Concern   Not on file  Social  History Narrative   Not on file   Social Determinants of Health   Financial Resource Strain: Not on file  Food Insecurity: Not on file  Transportation Needs: Not on file  Physical Activity: Unknown (05/06/2018)   Exercise Vital Sign    Days of Exercise per Week: 0 days    Minutes of Exercise per Session: Not on file  Stress: Stress Concern Present (05/06/2018)   [place]    Feeling of Stress : To some extent  Social Connections: Socially Integrated (05/06/2018)   Social Connection and Isolation Panel [NHANES]    Frequency of Communication with Friends and Family: More than three times a week    Frequency of Social Gatherings with Friends and Family: Once a week    Attends Religious Services: 1 to 4 times per year     Active Member of [employer] or Organizations: Yes    Attends Archivist Meetings: Never    Marital Status: Married  Human resources officer Violence: Not At Risk (05/06/2018)   Humiliation, Afraid, Rape, and Kick questionnaire    Fear of Current or Ex-Partner: No    Emotionally Abused: No    Physically Abused: No    Sexually Abused: No      Review of Systems  Constitutional:  Negative for fatigue and fever.  HENT:  Negative for congestion, mouth sores and postnasal drip.   Respiratory:  Negative for cough.   Cardiovascular:  Negative for chest pain.  Genitourinary:  Positive for dysuria and frequency. Negative for flank pain.  Skin:        Breast changes on left  Psychiatric/Behavioral: Negative.      Vital Signs: BP 110/79   Pulse 81   Temp 97.8 F (36.6 C)   Resp 16   Ht '5\' 7"'$  (1.702 m)   Wt 169 lb (76.7 kg)   SpO2 96%   BMI 26.47 kg/m    Physical Exam Vitals and nursing note reviewed.  Constitutional:      General: She is not in acute distress.    Appearance: Normal appearance. She is well-developed. She is not diaphoretic.  HENT:     Head: Normocephalic and atraumatic.     Mouth/Throat:     Pharynx: No oropharyngeal exudate.  Eyes:     Pupils: Pupils are equal, round, and reactive to light.  Neck:     Thyroid: No thyromegaly.     Vascular: No JVD.     Trachea: No tracheal deviation.  Cardiovascular:     Rate and Rhythm: Normal rate and regular rhythm.     Heart sounds: Normal heart sounds. No murmur heard.    No friction rub. No gallop.  Pulmonary:     Effort: Pulmonary effort is normal. No respiratory distress.     Breath sounds: No wheezing or rales.  Chest:     Chest wall: No tenderness.  Breasts:    Right: No mass.     Left: Mass present. No bleeding, inverted nipple or skin change.    Abdominal:     General: Bowel sounds are normal.     Palpations: Abdomen is soft.     Tenderness: There is no abdominal tenderness.  Musculoskeletal:         General: Normal range of motion.     Cervical back: Normal range of motion and neck supple.  Lymphadenopathy:     Cervical: No cervical adenopathy.  Skin:    General: Skin is warm  and dry.  Neurological:     Mental Status: She is alert and oriented to person, place, and time.     Cranial Nerves: No cranial nerve deficit.  Psychiatric:        Behavior: Behavior normal.        Thought Content: Thought content normal.        Judgment: Judgment normal.        Assessment/Plan: 1. Adenoma of breast, left Some changes near known fibroadenoma, will change to diagnostic mammogram with left [place] - MM DIAG BREAST TOMO BILATERAL; Future - US BREAST LTD UNI LEFT INC AXILLA; Future  2. Visit for screening mammogram Due to breast changes will adjust from screening to diagnostic with left [place] - MM DIAG BREAST TOMO BILATERAL; Future - US BREAST LTD UNI LEFT INC AXILLA; Future  3. Dysuria - POCT Urinalysis Dipstick - CULTURE, URINE COMPREHENSIVE   General Counseling: Monica verbalizes understanding of the findings of todays visit and agrees with plan of treatment. I have discussed any further diagnostic evaluation that may be needed or ordered today. We also reviewed her medications today. she has been encouraged to call the office with any questions or concerns that should arise related to todays visit.    Orders Placed This Encounter  Procedures   CULTURE, URINE COMPREHENSIVE   MM DIAG BREAST TOMO BILATERAL   US BREAST LTD UNI LEFT INC AXILLA   POCT Urinalysis Dipstick    No orders of the defined types were placed in this encounter.   This patient was seen by Drema Dallas, PA-C in collaboration with Dr. Clayborn Bigness as a part of collaborative care agreement.   Total time spent:30 Minutes Time spent includes review of chart, medications, test results, and follow up plan with the patient.      Dr Lavera Guise Internal medicine

## 2021-11-15 LAB — CULTURE, URINE COMPREHENSIVE

## 2021-11-30 ENCOUNTER — Ambulatory Visit
Admission: RE | Admit: 2021-11-30 | Discharge: 2021-11-30 | Disposition: A | Discharge status: Home / Self Care | Payer: BC Managed Care – PPO | Source: Ambulatory Visit | Attending: Physician Assistant | Admitting: Physician Assistant

## 2021-11-30 DIAGNOSIS — Z1231 Encounter for screening mammogram for malignant neoplasm of breast: Secondary | ICD-10-CM

## 2021-11-30 DIAGNOSIS — D242 Benign neoplasm of left breast: Secondary | ICD-10-CM | POA: Insufficient documentation

## 2021-12-01 ENCOUNTER — Other Ambulatory Visit: Payer: Self-pay | Admitting: Physician Assistant

## 2021-12-01 ENCOUNTER — Telehealth: Payer: Self-pay | Admitting: Physician Assistant

## 2021-12-01 DIAGNOSIS — D242 Benign neoplasm of left breast: Secondary | ICD-10-CM

## 2021-12-05 ENCOUNTER — Telehealth: Payer: Self-pay | Admitting: Physician Assistant

## 2021-12-05 NOTE — Telephone Encounter (Signed)
General Sugery referral sent via [facility] to Dr. Bary Castilla w/ [facility]

## 2021-12-22 ENCOUNTER — Telehealth: Payer: Self-pay | Admitting: Physician Assistant

## 2021-12-22 NOTE — Telephone Encounter (Signed)
General Surgery appointment 01/03/22 @ [facility]

## 2022-01-05 ENCOUNTER — Other Ambulatory Visit: Payer: Self-pay | Admitting: General Surgery

## 2022-01-05 NOTE — Progress Notes (Signed)
Progress Notes - documented in this encounter Andora Krull, Geronimo Boot, MD - 01/03/2022 3:15 PM EDT Formatting of this note is different from the original. Images from the original note were not included. Subjective:   Patient ID: Jodi Wright is a 41 y.o. female.  HPI  The following portions of the patient's history were reviewed and updated as appropriate.  This a new patient is here today for: office visit. Here for evaluation of a left breast fibroadenoma referred by Drema Dallas PA. She states she had a biopsy in April 2022 and now it "feels different" and is larger. No history of trauma to the breast.  The patient is a native of [place], [place]. [employer]. 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  She had a mammogram and left breast ultrasound on 11-30-21.  Review of Systems  Constitutional: Negative for chills and fever.  Respiratory: Negative for cough.   Chief Complaint  Patient presents with  New Patient    BP 124/72  Pulse 94  Temp 36.9 C (98.4 F)  Ht 170.2 cm ('5\' 7"' )  Wt 77.6 kg (171 lb)  LMP 12/11/2021  SpO2 98%  BMI 26.78 kg/m   Past Medical History:  Diagnosis Date  Chicken pox  Seasonal allergies    Past Surgical History:  Procedure Laterality Date  BREAST EXCISIONAL BIOPSY Right 2003  [place], benign  TOOTH EXTRACTION  wisdom teeth    OB History   Gravida  2  Para  2  Term   Preterm   AB   Living     SAB   IAB   Ectopic   Molar   Multiple   Live Births     Obstetric Comments  Age at first period 47 Age of first pregnancy 36      Social History   Socioeconomic History  Marital status: Married  Tobacco Use  Smoking status: Never  Smokeless tobacco: Never  Substance and Sexual Activity  Alcohol use: Never  Drug use: Never    Allergies  Allergen Reactions  Sulfa (Sulfonamide Antibiotics) Nausea and Vomiting  Ciprofloxacin Rash  Mucinex Sinus-Max Sev  Cong(Dm) [Phenyleph-Dm-Acetamin-Guaifen] Dizziness   Current Outpatient Medications  Medication Sig Dispense Refill  levocetirizine (XYZAL) 5 MG tablet Take 5 mg by mouth every evening  multivitamin tablet Take 1 tablet by mouth once daily  omega 3-dha-epa-fish oil (FISH OIL) 1,000 mg (120 mg-180 mg) Cap Take by mouth once daily   No current facility-administered medications for this visit.   Family History  Problem Relation Age of Onset  Hyperlipidemia (Elevated cholesterol) Mother  Diabetes Mother  High blood pressure (Hypertension) Mother  COPD Mother  Arthritis Mother  Mental illness Mother  High blood pressure (Hypertension) Father  Stroke Father  Hyperlipidemia (Elevated cholesterol) Father  Melanoma Father  Arthritis Maternal Grandmother  Colon cancer Maternal Grandmother  Lung cancer Maternal Grandfather  Diabetes Paternal Grandmother  Alcohol abuse Paternal Grandfather  Arthritis Maternal Aunt  Colon cancer Maternal Aunt  Dementia Maternal Aunt  Depression Maternal Aunt  Breast cancer Neg Hx   Labs and Radiology:   Breast imaging studies from June 28, 2020 through January 30, 2022 were independently reviewed. Enlarging mass in the upper outer quadrant of the left breast.  July 06, 2020 breast biopsy:  A. BREAST, LEFT AT 12:00, 2 CM FROM THE NIPPLE; ULTRASOUND-GUIDED CORE  NEEDLE BIOPSY:  - FRAGMENTS OF BENIGN FIBROADENOMA.  - NEGATIVE FOR ATYPICAL PROLIFERATIVE BREAST DISEASE.   October 26, 2021 laboratory:  Component Ref  Range & Units 2 mo ago WBC 3.4 - 10.8 x10E3/uL 6.9  RBC 3.77 - 5.28 x10E6/uL 4.79  Hemoglobin 11.1 - 15.9 g/dL 13.8  Hematocrit 34.0 - 46.6 % 42.2  MCV 79 - 97 fL 88  MCH 26.6 - 33.0 pg 28.8  MCHC 31.5 - 35.7 g/dL 32.7  RDW 11.7 - 15.4 % 11.8  Platelets 150 - 450 x10E3/uL 313  Neutrophils Not Estab. % 54  Lymphs Not Estab. % 32  Monocytes Not Estab. % 7  Eos Not Estab. % 6  Basos Not Estab. % 1  Neutrophils Absolute 1.4 - 7.0  x10E3/uL 3.7  Lymphocytes Absolute 0.7 - 3.1 x10E3/uL 2.2  Monocytes Absolute 0.1 - 0.9 x10E3/uL 0.5  EOS (ABSOLUTE) 0.0 - 0.4 x10E3/uL 0.4  Basophils Absolute 0.0 - 0.2 x10E3/uL 0.1  Immature Granulocytes Not Estab. % 0  Immature Grans (Abs) 0.0 - 0.1 x10E3/uL 0.0  Glucose 70 - 99 mg/dL 99  BUN 6 - 24 mg/dL 13  Creatinine, Ser 0.57 - 1.00 mg/dL 0.90  eGFR >59 mL/min/1.73 81  BUN/Creatinine Ratio 9 - 23 14  Sodium 134 - 144 mmol/L 139  Potassium 3.5 - 5.2 mmol/L 4.3  Chloride 96 - 106 mmol/L 102  CO2 20 - 29 mmol/L 22  Calcium 8.7 - 10.2 mg/dL 9.7  Total Protein 6.0 - 8.5 g/dL 6.4  Albumin 3.9 - 4.9 g/dL 4.5  Globulin, Total 1.5 - 4.5 g/dL 1.9  Albumin/Globulin Ratio 1.2 - 2.2 2.4 High  Bilirubin Total 0.0 - 1.2 mg/dL 0.4  Alkaline Phosphatase 44 - 121 IU/L 54  AST 0 - 40 IU/L 12  ALT 0 - 32 IU/L 16    Objective:  Physical Exam Exam conducted with a chaperone present.  Constitutional:  Appearance: Normal appearance.  Cardiovascular:  Rate and Rhythm: Normal rate and regular rhythm.  Pulses: Normal pulses.  Heart sounds: Normal heart sounds.  Pulmonary:  Effort: Pulmonary effort is normal.  Breath sounds: Normal breath sounds.  Chest:  Breasts: Right: Normal.  Left: Mass present.   Comments: 3 cm mobile mass in the upper outer quadrant of the left breast. Musculoskeletal:  Cervical back: Neck supple.  Lymphadenopathy:  Upper Body:  Right upper body: No supraclavicular or axillary adenopathy.  Left upper body: No supraclavicular or axillary adenopathy.  Skin: General: Skin is warm and dry.  Neurological:  Mental Status: She is alert and oriented to person, place, and time.  Psychiatric:  Mood and Affect: Mood normal.  Behavior: Behavior normal.    Assessment:   Enlarging fibroadenoma of the left breast.  Plan:   Considering the dramatic increase in size over the last year, and right irregardless of the benign biopsy, excision is warranted.  She will  be most comfortable with this being completed under anesthesia rather than as a local procedure.  Reviewed possibility of mild distortion of nipple sensation post surgery.  The procedure scheduled for January 27, 2022 to minimally interfere with her school duties.  This note is partially prepared by Karie Fetch, RN, acting as a scribe in the presence of Dr. Hervey Ard, MD.  The documentation recorded by the scribe accurately reflects the service I personally performed and the decisions made by me.   Robert Bellow, MD FACS  Electronically signed by Mayer Masker, MD at 01/05/2022 7:08 AM EDT

## 2022-01-18 ENCOUNTER — Encounter
Admission: RE | Admit: 2022-01-18 | Discharge: 2022-01-18 | Disposition: A | Discharge status: Home / Self Care | Payer: BC Managed Care – PPO | Source: Ambulatory Visit | Attending: General Surgery | Admitting: General Surgery

## 2022-01-18 ENCOUNTER — Other Ambulatory Visit: Payer: Self-pay

## 2022-01-18 DIAGNOSIS — Z01812 Encounter for preprocedural laboratory examination: Secondary | ICD-10-CM

## 2022-01-18 HISTORY — DX: Benign neoplasm of left breast: D24.2

## 2022-01-18 HISTORY — DX: Pneumonia, unspecified organism: J18.9

## 2022-01-18 HISTORY — DX: Unspecified osteoarthritis, unspecified site: M19.90

## 2022-01-18 HISTORY — DX: Personal history of urinary calculi: Z87.442

## 2022-01-18 NOTE — Patient Instructions (Addendum)
Your procedure is scheduled on: 01/27/22 - Friday Report to the Registration Desk on the 1st floor of the [facility]. To find out your arrival time, please call [phone] between 1PM - 3PM on: 01/26/22 - Thursday If your arrival time is 6:00 am, do not arrive prior to that time as the [facility] entrance doors do not open until 6:00 am.  REMEMBER: Instructions that are not followed completely may result in serious medical risk, up to and including death; or upon the discretion of your surgeon and anesthesiologist your surgery may need to be rescheduled.  Do not eat food after midnight the night before surgery.  No gum chewing, lozengers or hard candies.  You may however, drink CLEAR liquids up to 2 hours before you are scheduled to arrive for your surgery. Do not drink anything within 2 hours of your scheduled arrival time.  Clear liquids include: - water  - apple juice without pulp - gatorade (not RED colors) - black coffee or tea (Do NOT add milk or creamers to the coffee or tea) Do NOT drink anything that is not on this list.  TAKE THESE MEDICATIONS THE MORNING OF SURGERY WITH A SIP OF WATER: NONE  One week prior to surgery: Stop Anti-inflammatories (NSAIDS) such as Advil, Aleve, Ibuprofen, Motrin, Naproxen, Naprosyn and Aspirin based products such as Excedrin, Goodys Powder, BC Powder.  Stop ANY OVER THE COUNTER supplements until after surgery.  You may however, continue to take Tylenol if needed for pain up until the day of surgery.  No Alcohol for 24 hours before or after surgery.  No Smoking including e-cigarettes for 24 hours prior to surgery.  No chewable tobacco products for at least 6 hours prior to surgery.  No nicotine patches on the day of surgery.  Do not use any "recreational" drugs for at least a week prior to your surgery.  Please be advised that the combination of cocaine and anesthesia may have negative outcomes, up to and including death. If you test  positive for cocaine, your surgery will be cancelled.  On the morning of surgery brush your teeth with toothpaste and water, you may rinse your mouth with mouthwash if you wish. Do not swallow any toothpaste or mouthwash.  Do not wear jewelry, make-up, hairpins, clips or nail polish.  Do not wear lotions, powders, or perfumes.   Do not shave body from the neck down 48 hours prior to surgery just in case you cut yourself which could leave a site for infection.  Also, freshly shaved skin may become irritated if using the CHG soap.  Contact lenses, hearing aids and dentures may not be worn into surgery.  Do not bring valuables to the hospital. [facility] is not responsible for any missing/lost belongings or valuables.   Notify your doctor if there is any change in your medical condition (cold, fever, infection).  Wear comfortable clothing (specific to your surgery type) to the hospital.  After surgery, you can help prevent lung complications by doing breathing exercises.  Take deep breaths and cough every 1-2 hours. Your doctor may order a device called an Incentive Spirometer to help you take deep breaths. When coughing or sneezing, hold a pillow firmly against your incision with both hands. This is called "splinting." Doing this helps protect your incision. It also decreases belly discomfort.  If you are being admitted to the hospital overnight, leave your suitcase in the car. After surgery it may be brought to your room.  If  you are being discharged the day of surgery, you will not be allowed to drive home. You will need a responsible adult (18 years or older) to drive you home and stay with you that night.   If you are taking public transportation, you will need to have a responsible adult (18 years or older) with you. Please confirm with your physician that it is acceptable to use public transportation.   Please call the [facility] at [phone] if you have  any questions about these instructions.  Surgery Visitation Policy:  Patients undergoing a surgery or procedure may have two family members or support persons with them as long as the person is not COVID-19 positive or experiencing its symptoms.   Inpatient Visitation:    Visiting hours are 7 a.m. to 8 p.m. Up to four visitors are allowed at one time in a patient room, including children. The visitors may rotate out with other people during the day. One designated support person (adult) may remain overnight.

## 2022-01-19 NOTE — Telephone Encounter (Signed)
error 

## 2022-01-26 NOTE — Anesthesia Preprocedure Evaluation (Addendum)
Anesthesia Evaluation  Patient identified by MRN, date of birth, ID band Patient awake    Reviewed: Allergy & Precautions, NPO status , Patient's Chart, lab work & pertinent test results  Airway Mallampati: III  TM Distance: >3 FB Neck ROM: full    Dental no notable dental hx.    Pulmonary neg pulmonary ROS   Pulmonary exam normal        Cardiovascular negative cardio ROS Normal cardiovascular exam     Neuro/Psych negative neurological ROS  negative psych ROS   GI/Hepatic Neg liver ROS,GERD  Controlled,,  Endo/Other  negative endocrine ROS    Renal/GU      Musculoskeletal   Abdominal Normal abdominal exam  (+)   Peds  Hematology negative hematology ROS (+)   Anesthesia Other Findings fibroadenoma left breast  Past Medical History: No date: Arthritis No date: Chicken pox No date: Fibroadenoma of breast, left No date: History of kidney stones No date: Pneumonia No date: Seasonal allergies  Past Surgical History: 2003: BREAST BIOPSY; Right     Comment:  extra breast tissue in right breast under arm No date: DILATION AND CURETTAGE OF UTERUS 2000: WISDOM TOOTH EXTRACTION     Reproductive/Obstetrics negative OB ROS                             Anesthesia Physical Anesthesia Plan  ASA: 2  Anesthesia Plan: General LMA   Post-op Pain Management: Tylenol PO (pre-op)* and Celebrex PO (pre-op)*   Induction: Intravenous  PONV Risk Score and Plan: 3 and Dexamethasone, Ondansetron and Midazolam  Airway Management Planned: LMA  Additional Equipment:   Intra-op Plan:   Post-operative Plan: Extubation in OR  Informed Consent: I have reviewed the patients History and Physical, chart, labs and discussed the procedure including the risks, benefits and alternatives for the proposed anesthesia with the patient or authorized representative who has indicated his/her understanding and  acceptance.     Dental Advisory Given  Plan Discussed with: Anesthesiologist, CRNA and Surgeon  Anesthesia Plan Comments:         Anesthesia Quick Evaluation

## 2022-01-27 ENCOUNTER — Ambulatory Visit: Payer: BC Managed Care – PPO | Admitting: Anesthesiology

## 2022-01-27 ENCOUNTER — Ambulatory Visit
Admission: RE | Admit: 2022-01-27 | Discharge: 2022-01-27 | Disposition: A | Discharge status: Home / Self Care | Payer: BC Managed Care – PPO | Attending: General Surgery | Admitting: General Surgery

## 2022-01-27 ENCOUNTER — Other Ambulatory Visit: Payer: Self-pay

## 2022-01-27 ENCOUNTER — Encounter: Payer: Self-pay | Admitting: General Surgery

## 2022-01-27 ENCOUNTER — Encounter
Admission: RE | Disposition: A | Discharge status: Home / Self Care | Payer: Self-pay | Source: Home / Self Care | Attending: General Surgery

## 2022-01-27 DIAGNOSIS — K219 Gastro-esophageal reflux disease without esophagitis: Secondary | ICD-10-CM | POA: Diagnosis not present

## 2022-01-27 DIAGNOSIS — Z01812 Encounter for preprocedural laboratory examination: Secondary | ICD-10-CM

## 2022-01-27 DIAGNOSIS — D242 Benign neoplasm of left breast: Secondary | ICD-10-CM | POA: Diagnosis present

## 2022-01-27 HISTORY — PX: EXCISION OF BREAST LESION: SHX6676

## 2022-01-27 LAB — POCT PREGNANCY, URINE: Preg Test, Ur: NEGATIVE

## 2022-01-27 SURGERY — EXCISION OF BREAST LESION
Anesthesia: General | Site: Breast | Laterality: Left

## 2022-01-27 MED ORDER — CHLORHEXIDINE GLUCONATE 0.12 % MT SOLN: 15.0000 mL | Freq: Once | OROMUCOSAL | Status: AC

## 2022-01-27 MED ORDER — EPHEDRINE 5 MG/ML INJ
INTRAVENOUS | Status: AC
  Filled 2022-01-27: qty 5

## 2022-01-27 MED ORDER — LIDOCAINE HCL (CARDIAC) PF 100 MG/5ML IV SOSY
PREFILLED_SYRINGE | INTRAVENOUS | Status: DC | PRN
  Administered 2022-01-27: 100 mg via INTRAVENOUS

## 2022-01-27 MED ORDER — PROPOFOL 10 MG/ML IV BOLUS
INTRAVENOUS | Status: AC
  Filled 2022-01-27: qty 20

## 2022-01-27 MED ORDER — MIDAZOLAM HCL 2 MG/2ML IJ SOLN
INTRAMUSCULAR | Status: DC | PRN
  Administered 2022-01-27: 2 mg via INTRAVENOUS

## 2022-01-27 MED ORDER — FENTANYL CITRATE (PF) 100 MCG/2ML IJ SOLN
INTRAMUSCULAR | Status: AC
  Filled 2022-01-27: qty 2

## 2022-01-27 MED ORDER — ACETAMINOPHEN 500 MG PO TABS: 1000.0000 mg | ORAL_TABLET | Freq: Once | ORAL | Status: AC

## 2022-01-27 MED ORDER — CHLORHEXIDINE GLUCONATE CLOTH 2 % EX PADS: 6.0000 | MEDICATED_PAD | Freq: Once | CUTANEOUS | Status: DC

## 2022-01-27 MED ORDER — LACTATED RINGERS IV SOLN: INTRAVENOUS | Status: DC

## 2022-01-27 MED ORDER — BUPIVACAINE-EPINEPHRINE (PF) 0.5% -1:200000 IJ SOLN
INTRAMUSCULAR | Status: AC
  Filled 2022-01-27: qty 30

## 2022-01-27 MED ORDER — PHENYLEPHRINE 80 MCG/ML (10ML) SYRINGE FOR IV PUSH (FOR BLOOD PRESSURE SUPPORT)
PREFILLED_SYRINGE | INTRAVENOUS | Status: AC
  Filled 2022-01-27: qty 10

## 2022-01-27 MED ORDER — CHLORHEXIDINE GLUCONATE 0.12 % MT SOLN
OROMUCOSAL | Status: AC
  Administered 2022-01-27: 15 mL via OROMUCOSAL
  Filled 2022-01-27: qty 15

## 2022-01-27 MED ORDER — FENTANYL CITRATE (PF) 100 MCG/2ML IJ SOLN
INTRAMUSCULAR | Status: DC | PRN
  Administered 2022-01-27 (×2): 50 ug via INTRAVENOUS

## 2022-01-27 MED ORDER — EPHEDRINE SULFATE (PRESSORS) 50 MG/ML IJ SOLN
INTRAMUSCULAR | Status: DC | PRN
  Administered 2022-01-27: 5 mg via INTRAVENOUS

## 2022-01-27 MED ORDER — ACETAMINOPHEN 500 MG PO TABS
ORAL_TABLET | ORAL | Status: AC
  Administered 2022-01-27: 1000 mg via ORAL
  Filled 2022-01-27: qty 2

## 2022-01-27 MED ORDER — DEXMEDETOMIDINE HCL IN NACL 200 MCG/50ML IV SOLN
INTRAVENOUS | Status: DC | PRN
  Administered 2022-01-27: 8 ug via INTRAVENOUS

## 2022-01-27 MED ORDER — FAMOTIDINE 20 MG PO TABS: 20.0000 mg | ORAL_TABLET | Freq: Once | ORAL | Status: AC

## 2022-01-27 MED ORDER — PHENYLEPHRINE HCL (PRESSORS) 10 MG/ML IV SOLN
INTRAVENOUS | Status: DC | PRN
  Administered 2022-01-27 (×2): 80 ug via INTRAVENOUS
  Administered 2022-01-27: 160 ug via INTRAVENOUS
  Administered 2022-01-27: 80 ug via INTRAVENOUS

## 2022-01-27 MED ORDER — DEXAMETHASONE SODIUM PHOSPHATE 10 MG/ML IJ SOLN
INTRAMUSCULAR | Status: AC
  Filled 2022-01-27: qty 1

## 2022-01-27 MED ORDER — DEXMEDETOMIDINE HCL IN NACL 80 MCG/20ML IV SOLN
INTRAVENOUS | Status: AC
  Filled 2022-01-27: qty 20

## 2022-01-27 MED ORDER — PROPOFOL 10 MG/ML IV BOLUS
INTRAVENOUS | Status: DC | PRN
  Administered 2022-01-27: 150 mg via INTRAVENOUS

## 2022-01-27 MED ORDER — LIDOCAINE HCL (PF) 2 % IJ SOLN
INTRAMUSCULAR | Status: AC
  Filled 2022-01-27: qty 5

## 2022-01-27 MED ORDER — DEXAMETHASONE SODIUM PHOSPHATE 10 MG/ML IJ SOLN
INTRAMUSCULAR | Status: DC | PRN
  Administered 2022-01-27: 10 mg via INTRAVENOUS

## 2022-01-27 MED ORDER — CHLORHEXIDINE GLUCONATE CLOTH 2 % EX PADS
6.0000 | MEDICATED_PAD | Freq: Once | CUTANEOUS | Status: AC
  Administered 2022-01-27: 6 via TOPICAL

## 2022-01-27 MED ORDER — ONDANSETRON HCL 4 MG/2ML IJ SOLN
INTRAMUSCULAR | Status: AC
  Filled 2022-01-27: qty 2

## 2022-01-27 MED ORDER — BUPIVACAINE-EPINEPHRINE (PF) 0.5% -1:200000 IJ SOLN
INTRAMUSCULAR | Status: DC | PRN
  Administered 2022-01-27: 30 mL via PERINEURAL

## 2022-01-27 MED ORDER — ONDANSETRON HCL 4 MG/2ML IJ SOLN
INTRAMUSCULAR | Status: DC | PRN
  Administered 2022-01-27: 4 mg via INTRAVENOUS

## 2022-01-27 MED ORDER — FAMOTIDINE 20 MG PO TABS
ORAL_TABLET | ORAL | Status: AC
  Administered 2022-01-27: 20 mg via ORAL
  Filled 2022-01-27: qty 1

## 2022-01-27 MED ORDER — STERILE WATER FOR IRRIGATION IR SOLN
Status: DC | PRN
  Administered 2022-01-27: 600 mL

## 2022-01-27 MED ORDER — ORAL CARE MOUTH RINSE: 15.0000 mL | Freq: Once | OROMUCOSAL | Status: AC

## 2022-01-27 MED ORDER — HYDROCODONE-ACETAMINOPHEN 5-325 MG PO TABS: 1.0000 | ORAL_TABLET | ORAL | 0 refills | Status: DC | PRN

## 2022-01-27 MED ORDER — MIDAZOLAM HCL 2 MG/2ML IJ SOLN
INTRAMUSCULAR | Status: AC
  Filled 2022-01-27: qty 2

## 2022-01-27 SURGICAL SUPPLY — 54 items
APL PRP STRL LF DISP 70% ISPRP (MISCELLANEOUS)
BLADE BOVIE TIP EXT 4 (BLADE) IMPLANT
BLADE PHOTON ILLUMINATED (MISCELLANEOUS) IMPLANT
BLADE SURG 15 STRL SS SAFETY (BLADE) ×2 IMPLANT
BULB RESERV EVAC DRAIN JP 100C (MISCELLANEOUS) IMPLANT
CHLORAPREP W/TINT 26 (MISCELLANEOUS) ×1 IMPLANT
CNTNR SPEC 2.5X3XGRAD LEK (MISCELLANEOUS)
CONT SPEC 4OZ STER OR WHT (MISCELLANEOUS)
CONT SPEC 4OZ STRL OR WHT (MISCELLANEOUS)
CONTAINER SPEC 2.5X3XGRAD LEK (MISCELLANEOUS) IMPLANT
COVER PROBE FLX POLY STRL (MISCELLANEOUS) ×1 IMPLANT
DEVICE DUBIN SPECIMEN MAMMOGRA (MISCELLANEOUS) ×1 IMPLANT
DRAIN CHANNEL JP 15F RND 16 (MISCELLANEOUS) IMPLANT
DRAPE LAPAROTOMY TRNSV 106X77 (MISCELLANEOUS) ×1 IMPLANT
DRSG GAUZE FLUFF 36X18 (GAUZE/BANDAGES/DRESSINGS) ×1 IMPLANT
DRSG TELFA 3X8 NADH STRL (GAUZE/BANDAGES/DRESSINGS) ×1 IMPLANT
ELECT CAUTERY BLADE 6.4 (BLADE) ×1 IMPLANT
ELECT CAUTERY BLADE TIP 2.5 (TIP)
ELECT REM PT RETURN 9FT ADLT (ELECTROSURGICAL) ×1
ELECTRODE CAUTERY BLDE TIP 2.5 (TIP) IMPLANT
ELECTRODE REM PT RTRN 9FT ADLT (ELECTROSURGICAL) ×1 IMPLANT
GAUZE 4X4 16PLY ~~LOC~~+RFID DBL (SPONGE) ×1 IMPLANT
GLOVE BIO SURGEON STRL SZ7.5 (GLOVE) ×1 IMPLANT
GLOVE SURG UNDER LTX SZ8 (GLOVE) ×1 IMPLANT
GOWN STRL REUS W/ TWL LRG LVL3 (GOWN DISPOSABLE) ×2 IMPLANT
GOWN STRL REUS W/TWL LRG LVL3 (GOWN DISPOSABLE) ×2
KIT TURNOVER KIT A (KITS) ×1 IMPLANT
LABEL OR SOLS (LABEL) ×1 IMPLANT
MANIFOLD NEPTUNE II (INSTRUMENTS) ×1 IMPLANT
MARGIN MAP 10MM (MISCELLANEOUS) IMPLANT
NDL HYPO 25X1 1.5 SAFETY (NEEDLE) ×2 IMPLANT
NEEDLE HYPO 22GX1.5 SAFETY (NEEDLE) ×1 IMPLANT
NEEDLE HYPO 25X1 1.5 SAFETY (NEEDLE) ×2 IMPLANT
PACK BASIN MINOR ARMC (MISCELLANEOUS) ×1 IMPLANT
SHEARS FOC LG CVD HARMONIC 17C (MISCELLANEOUS) IMPLANT
SHEARS HARMONIC 9CM CVD (BLADE) IMPLANT
STRIP CLOSURE SKIN 1/2X4 (GAUZE/BANDAGES/DRESSINGS) ×1 IMPLANT
SUT ETHILON 3-0 FS-10 30 BLK (SUTURE)
SUT SILK 2 0 (SUTURE) ×1
SUT SILK 2-0 18XBRD TIE 12 (SUTURE) ×1 IMPLANT
SUT VIC AB 2-0 CT1 27 (SUTURE) ×1
SUT VIC AB 2-0 CT1 TAPERPNT 27 (SUTURE) ×2 IMPLANT
SUT VIC AB 3-0 54X BRD REEL (SUTURE) IMPLANT
SUT VIC AB 3-0 BRD 54 (SUTURE)
SUT VIC AB 4-0 FS2 27 (SUTURE) ×1 IMPLANT
SUTURE EHLN 3-0 FS-10 30 BLK (SUTURE) IMPLANT
SWABSTK COMLB BENZOIN TINCTURE (MISCELLANEOUS) ×1 IMPLANT
SYR 10ML LL (SYRINGE) ×1 IMPLANT
SYR BULB IRRIG 60ML STRL (SYRINGE) ×1 IMPLANT
TAPE TRANSPORE STRL 2 31045 (GAUZE/BANDAGES/DRESSINGS) IMPLANT
TRAP FLUID SMOKE EVACUATOR (MISCELLANEOUS) ×1 IMPLANT
TRAP NEPTUNE SPECIMEN COLLECT (MISCELLANEOUS) ×1 IMPLANT
WATER STERILE IRR 1000ML POUR (IV SOLUTION) ×1 IMPLANT
WATER STERILE IRR 500ML POUR (IV SOLUTION) ×1 IMPLANT

## 2022-01-27 NOTE — Transfer of Care (Signed)
Immediate Anesthesia Transfer of Care Note  Patient: Clio Gerhart  Procedure(s) Performed: EXCISION OF BREAST LESION (Left: Breast)  Patient Location: PACU  Anesthesia Type:General  Level of Consciousness: drowsy  Airway & Oxygen Therapy: Patient Spontanous Breathing and Patient connected to face mask oxygen  Post-op Assessment: Report given to RN and Post -op Vital signs reviewed and stable  Post vital signs: Reviewed and stable  Last Vitals:  Vitals Value Taken Time  BP 121/73 01/27/22 0823  Temp    Pulse 107 01/27/22 0825  Resp 14   SpO2 93 % 01/27/22 0825  Vitals shown include unvalidated device data.  Last Pain:  Vitals:   01/27/22 0629  TempSrc: Temporal  PainSc: 0-No pain         Complications: No notable events documented.

## 2022-01-27 NOTE — Anesthesia Postprocedure Evaluation (Signed)
Anesthesia Post Note  Patient: Jodi Wright  Procedure(s) Performed: EXCISION OF BREAST LESION (Left: Breast)  Patient location during evaluation: PACU Anesthesia Type: General Level of consciousness: awake and alert Pain management: pain level controlled Vital Signs Assessment: post-procedure vital signs reviewed and stable Respiratory status: spontaneous breathing, nonlabored ventilation and respiratory function stable Cardiovascular status: blood pressure returned to baseline and stable Postop Assessment: no apparent nausea or vomiting Anesthetic complications: no   No notable events documented.   Last Vitals:  Vitals:   01/27/22 0845 01/27/22 0900  BP: 110/72 122/81  Pulse: (!) 104 92  Resp:  16  Temp:  36.9 C  SpO2: 95% 97%    Last Pain:  Vitals:   01/27/22 0900  TempSrc: Temporal  PainSc: 0-No pain                 [place] Ouch

## 2022-01-27 NOTE — Op Note (Signed)
Preoperative diagnosis: Enlarging fibroadenoma of the left breast.  Postoperative diagnosis: Same.  Operative procedure: Excision left breast fibroadenoma.  Operating surgeon: Hervey Ard, MD.  Anesthesia: General by LMA, Marcaine 0.5% with 1: 200,000 units of epinephrine, 30 cc  Estimated blood loss: Less than 5 cc.  Clinical note this 41 year old woman has had a slowly enlarging fibroadenoma in the left breast status post core biopsy in April 2022.  The areas doubled in volume.  She is felt to be candidate for excision.  Operative note: The patient had SCD stockings for DVT prevention.  The breast and chest was cleansed with Betadine solution due to a topical reaction to chlorhexidine.  She underwent general anesthesia and tolerated this well.  A circumareolar incision was made after local anesthesia was infiltrated.  Incision was from the 12 to 2 o'clock position.  Skin was incised sharply and remaining dissection completed electrocautery.  Subcutaneous fat was elevated off the fibroadenoma which was approximately 3 cm from the nipple.  This was then excised and electrocautery.  Specimen was oriented and sent for routine.  The deep tissue defect was approximated with a single 2-0 Vicryl figure-of-eight suture.  The adipose tissue was approximated in a similar fashion.  The skin was closed with a running 4-0 Vicryl subcuticular suture.  Benzoin, Steri-Strips, Telfa, fluff gauze and a compressive wrap were applied.  Patient tolerated procedure well was taken to PACU in stable condition.

## 2022-01-27 NOTE — Discharge Instructions (Signed)
AMBULATORY SURGERY  ?DISCHARGE INSTRUCTIONS ? ? ?The drugs that you were given will stay in your system until tomorrow so for the next 24 hours you should not: ? ?Drive an automobile ?Make any legal decisions ?Drink any alcoholic beverage ? ? ?You may resume regular meals tomorrow.  Today it is better to start with liquids and gradually work up to solid foods. ? ?You may eat anything you prefer, but it is better to start with liquids, then soup and crackers, and gradually work up to solid foods. ? ? ?Please notify your doctor immediately if you have any unusual bleeding, trouble breathing, redness and pain at the surgery site, drainage, fever, or pain not relieved by medication. ? ? ? ?Additional Instructions: ? ? ? ?Please contact your physician with any problems or Same Day Surgery at [phone], Monday through Friday 6 am to 4 pm, or [facility] [place] [facility] number at [phone].  ?

## 2022-01-27 NOTE — Anesthesia Procedure Notes (Signed)
Procedure Name: LMA Insertion Date/Time: 01/27/2022 7:42 AM  Performed by: Cammie Sickle, CRNAPre-anesthesia Checklist: Patient identified, Patient being monitored, Timeout performed, Emergency Drugs available and Suction available Patient Re-evaluated:Patient Re-evaluated prior to induction Oxygen Delivery Method: Circle system utilized Preoxygenation: Pre-oxygenation with 100% oxygen Induction Type: IV induction Ventilation: Mask ventilation without difficulty LMA: LMA inserted LMA Size: 4.0 Tube type: Oral Number of attempts: 1 Placement Confirmation: positive ETCO2 and breath sounds checked- equal and bilateral Tube secured with: Tape Dental Injury: Teeth and Oropharynx as per pre-operative assessment

## 2022-01-27 NOTE — H&P (Signed)
Jodi Wright [id number] 08-Jun-1980     HPI:  Enlarging left breast mass. For excision.   Medications Prior to Admission  Medication Sig Dispense Refill Last Dose   Omega-3 Fatty Acids (FISH OIL) 1000 MG CAPS Take by mouth daily.   Past Week   ibuprofen (ADVIL) 200 MG tablet Take 200 mg by mouth every 8 (eight) hours as needed.      levocetirizine (XYZAL) 5 MG tablet Take 5 mg by mouth every evening.   01/25/2022   Multiple Vitamin (MULTIVITAMIN ADULT PO) Take by mouth.   01/25/2022   Allergies  Allergen Reactions   Sulfa Antibiotics Nausea And Vomiting   Chlorhexidine Itching    Pt. reports itching when wiped off with CHG wipes    Ciprofloxacin Rash   Mucinex Dm [Dm-Guaifenesin Er] Hypertension    Felt dizzy   Past Medical History:  Diagnosis Date   Arthritis    Chicken pox    Fibroadenoma of breast, left    History of kidney stones    Pneumonia    Seasonal allergies    Past Surgical History:  Procedure Laterality Date   BREAST BIOPSY Right 2003   extra breast tissue in right breast under arm   DILATION AND CURETTAGE OF UTERUS     WISDOM TOOTH EXTRACTION  2000   Social History   Socioeconomic History   Marital status: Married    Spouse name: Ray   Number of children: 2   Years of education: Not on file   Highest education level: Not on file  Occupational History   Not on file  Tobacco Use   Smoking status: Never   Smokeless tobacco: Never  Vaping Use   Vaping Use: Never used  Substance and Sexual Activity   Alcohol use: Yes    Alcohol/week: 0.0 - 1.0 standard drinks of alcohol    Comment: occ   Drug use: No   Sexual activity: Yes    Partners: Male    Birth control/protection: None  Other Topics Concern   Not on file  Social History Narrative   Not on file   Social Determinants of Health   Financial Resource Strain: Not on file  Food Insecurity: Not on file  Transportation Needs: Not on file  Physical Activity: Unknown (05/06/2018)   Exercise  Vital Sign    Days of Exercise per Week: 0 days    Minutes of Exercise per Session: Not on file  Stress: Stress Concern Present (05/06/2018)   [place]    Feeling of Stress : To some extent  Social Connections: Socially Integrated (05/06/2018)   Social Connection and Isolation Panel [NHANES]    Frequency of Communication with Friends and Family: More than three times a week    Frequency of Social Gatherings with Friends and Family: Once a week    Attends Religious Services: 1 to 4 times per year    Active Member of [employer] or Organizations: Yes    Attends Archivist Meetings: Never    Marital Status: Married  Human resources officer Violence: Not At Risk (05/06/2018)   Humiliation, Afraid, Rape, and Kick questionnaire    Fear of Current or Ex-Partner: No    Emotionally Abused: No    Physically Abused: No    Sexually Abused: No   Social History   Social History Narrative   Not on file     ROS: Negative.     PE: HEENT: Negative. Lungs: Clear. Cardio:  RR.  Assessment/Plan:  Proceed with planned left breast excisional biopsy.  [facility] 01/27/2022

## 2022-01-28 ENCOUNTER — Encounter: Payer: Self-pay | Admitting: General Surgery

## 2022-01-30 LAB — SURGICAL PATHOLOGY

## 2022-05-25 IMAGING — MG DIGITAL DIAGNOSTIC BILAT W/ TOMO W/ CAD
6 of 10 series · 6 of 30 positions shown · non-contrast
Comparison: None.

CLINICAL DATA: 40-year-old female complaining of a new palpable
left breast mass.

EXAM:
DIGITAL DIAGNOSTIC BILATERAL MAMMOGRAM WITH TOMOSYNTHESIS AND CAD;
ULTRASOUND LEFT BREAST LIMITED; ULTRASOUND RIGHT BREAST LIMITED
TECHNIQUE: Bilateral digital diagnostic mammography and breast tomosynthesis
was performed. The images were evaluated with computer-aided
detection.; Targeted ultrasound examination of the left breast was
performed; Targeted ultrasound examination of the right breast was
performed

[R CC synth-2D]
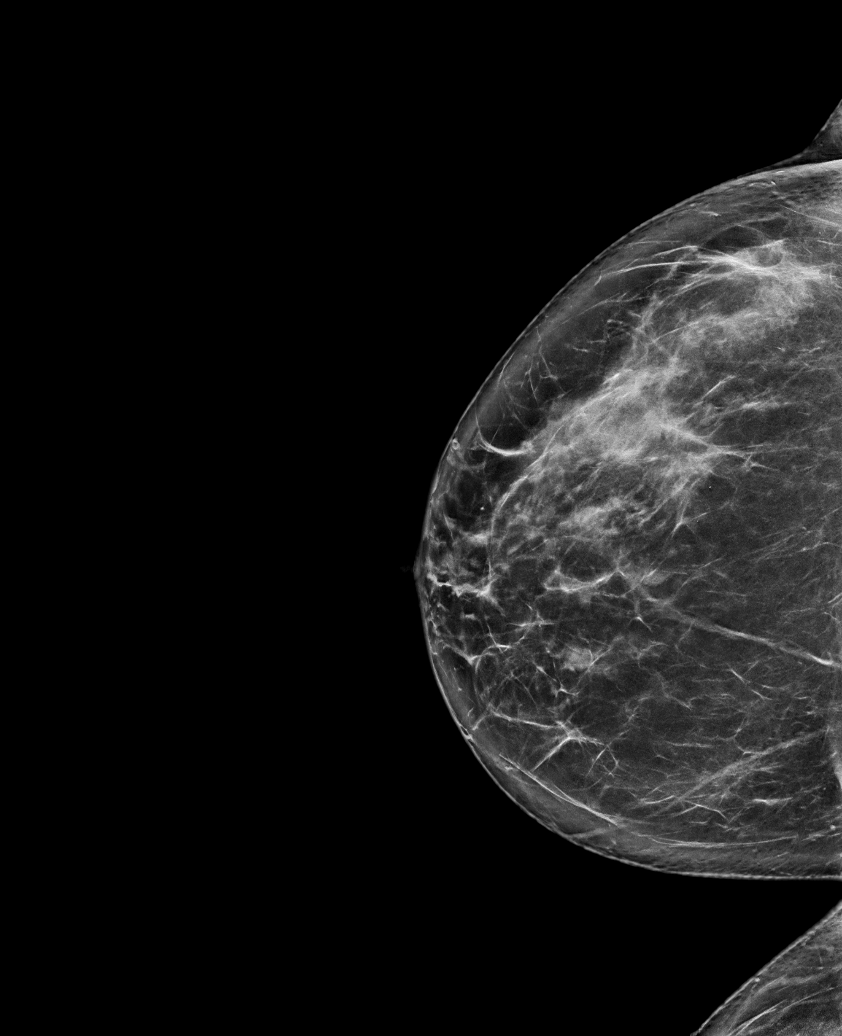

[L MLO synth-2D]
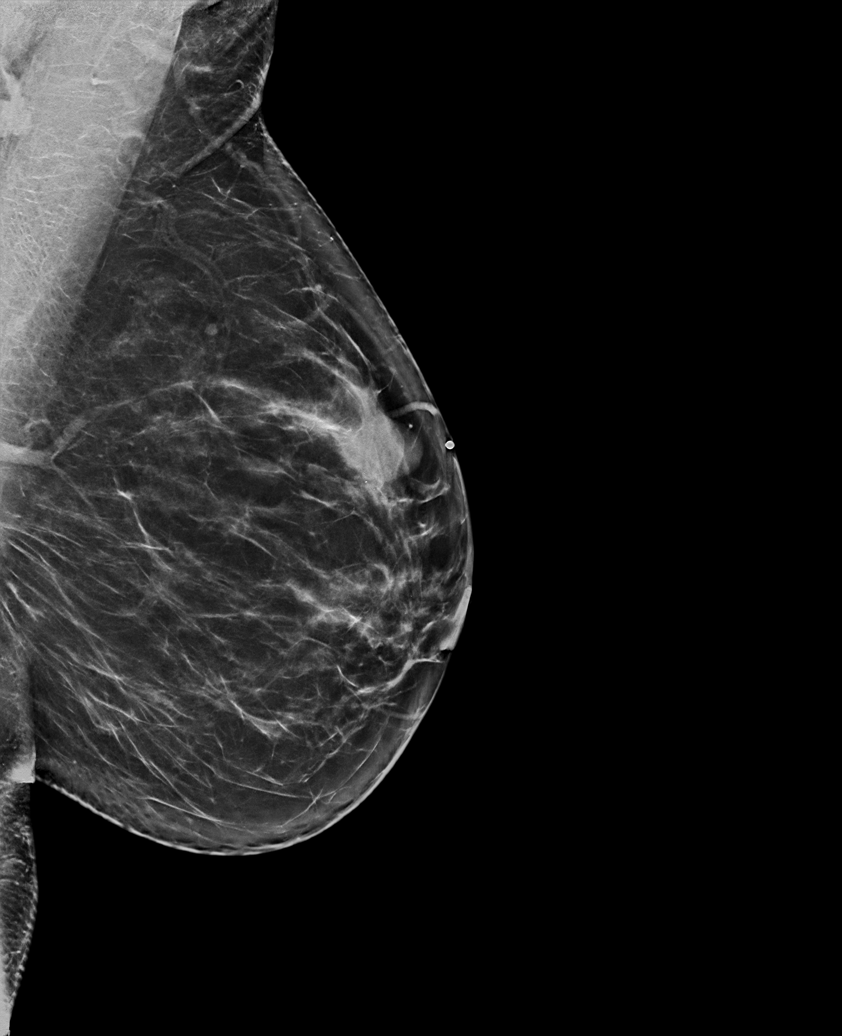

[R MLO synth-2D]
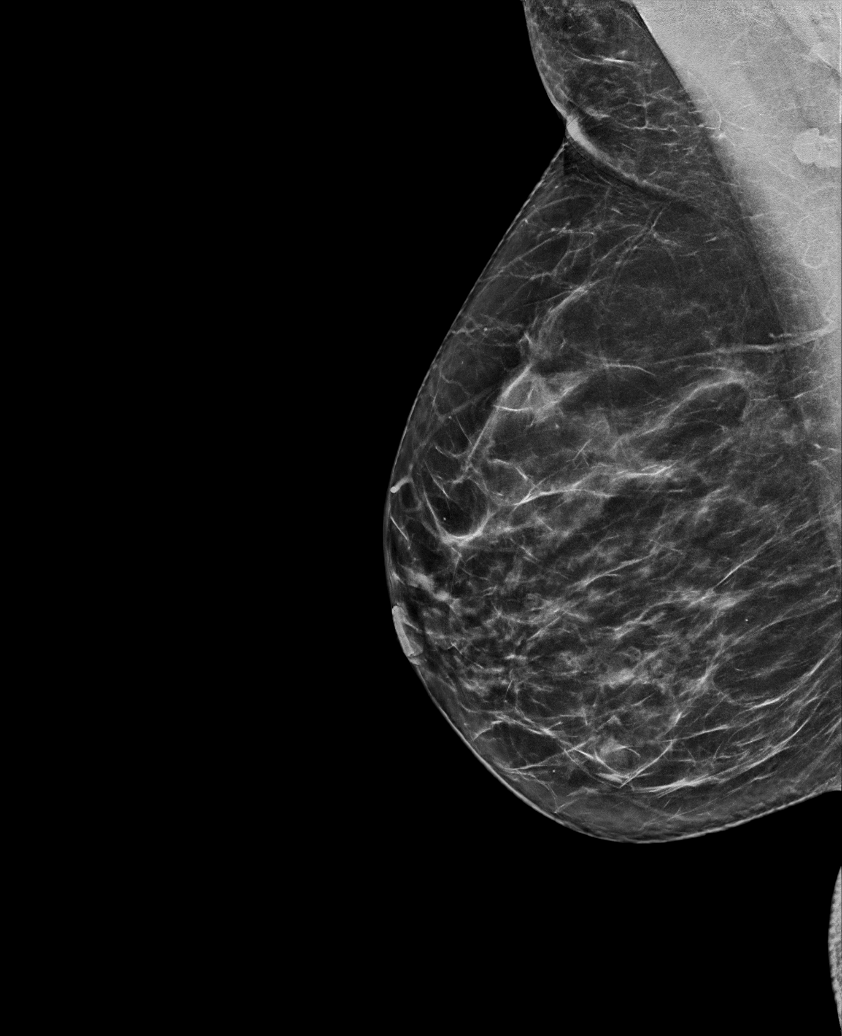

[L CC synth-2D]
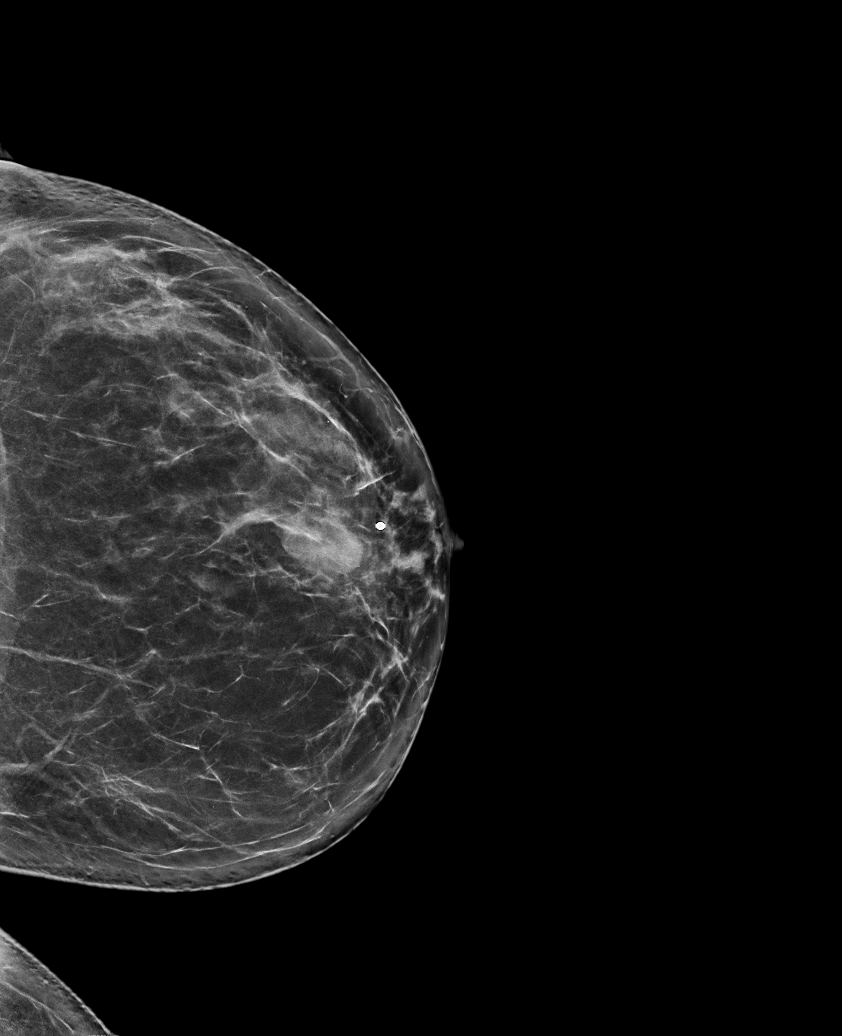

[L TAN synth-2D]
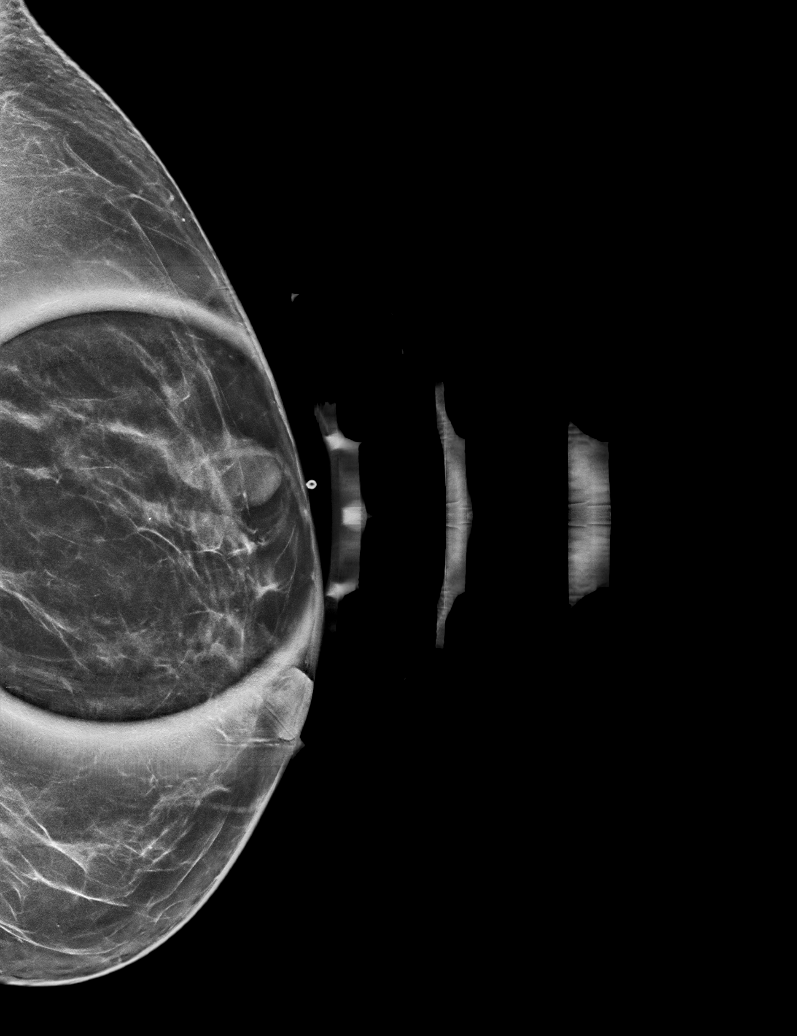

[R CC tomo · tomo slice 42/83.0]
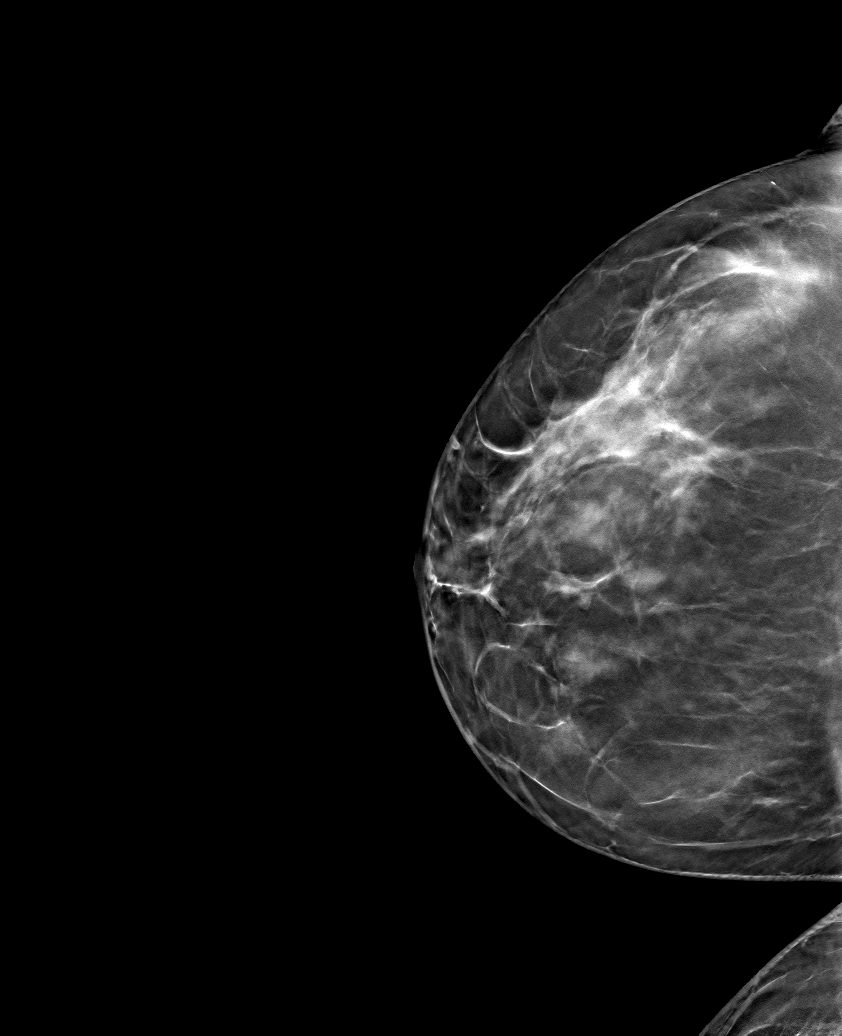

[6 of 30 positions shown; findings below may reference images not displayed]

ACR Breast Density Category b: There are scattered areas of
fibroglandular density.
FINDINGS: There is a 8 mm mass in the lower slightly inner quadrant of the
anterior third of the right breast. There is a 2.1 cm mass in 12
o'clock region the left breast. There are no malignant type
microcalcifications in either breast.

On physical exam, no mass is palpated in the right breast. There is
a palpable mass in the 12 o'clock region of the left breast 2 cm
from the nipple.

Targeted ultrasound is performed, showing a benign appearing cyst in
the right breast at 5-6 o'clock 3 cm from the nipple measuring 8 x 6
x 8 mm.

In the left breast at 12 o'clock 2 cm from the nipple there is a
well-circumscribed hypoechoic mass with increased through
transmission measuring 1.7 x 1.4 x 1.3 cm. Sonographic evaluation of
the left axilla does not show any enlarged adenopathy.
IMPRESSION: Indeterminate mass in the 12 o'clock region of the left breast 2 cm
from the nipple. No evidence of malignancy in the right breast.

RECOMMENDATION:
Ultrasound-guided core biopsy of the mass in the 12 o'clock region
of the left breast is recommended.

I have discussed the findings and recommendations with the patient.
If applicable, a reminder letter will be sent to the patient
regarding the next appointment.

BI-RADS CATEGORY  4: Suspicious.

## 2022-10-27 ENCOUNTER — Encounter: Payer: BC Managed Care – PPO | Admitting: Physician Assistant

## 2022-11-03 ENCOUNTER — Ambulatory Visit (INDEPENDENT_AMBULATORY_CARE_PROVIDER_SITE_OTHER): Payer: BC Managed Care – PPO | Admitting: Physician Assistant

## 2022-11-03 ENCOUNTER — Encounter: Payer: Self-pay | Admitting: Physician Assistant

## 2022-11-03 VITALS — BP 108/79 | HR 86 | Temp 97.8°F | Resp 16 | Ht 67.0 in | Wt 174.0 lb

## 2022-11-03 DIAGNOSIS — E782 Mixed hyperlipidemia: Secondary | ICD-10-CM | POA: Diagnosis not present

## 2022-11-03 DIAGNOSIS — Z0001 Encounter for general adult medical examination with abnormal findings: Secondary | ICD-10-CM

## 2022-11-03 DIAGNOSIS — R300 Dysuria: Secondary | ICD-10-CM

## 2022-11-03 DIAGNOSIS — R7989 Other specified abnormal findings of blood chemistry: Secondary | ICD-10-CM

## 2022-11-03 DIAGNOSIS — R5383 Other fatigue: Secondary | ICD-10-CM

## 2022-11-03 DIAGNOSIS — Z1231 Encounter for screening mammogram for malignant neoplasm of breast: Secondary | ICD-10-CM

## 2022-11-03 MED ORDER — TETANUS-DIPHTH-ACELL PERTUSSIS 5-2.5-18.5 LF-MCG/0.5 IM SUSP: 0.5000 mL | Freq: Once | INTRAMUSCULAR | 0 refills | Status: AC

## 2022-11-03 NOTE — Progress Notes (Signed)
[facility] [address] [place], [place] [address]  Internal MEDICINE  Office Visit Note  Patient Name: Jodi Wright  [id number]  [id number]  Date of Service: 11/14/2022  Chief Complaint  Patient presents with   Annual Exam     HPI Pt is here for routine health maintenance examination -Excision for fibroadenoma went well, due for next screening mammo -Last thurs had a headache all day and wouldn't go away even with ibuprofen. Woke up the next day and felt a little better and was back and forth until Tuesday. Felt like heat coming from ears but no pressure. Has been told she is perimenopausal and may be due to this. Had also been to [facility] Wednesday before and it was hot outside all day and may have been dehydrated. She is feeling better now.  -did have an eye exam in April/May -due for labs -will update tdap  Current Medication: Outpatient Encounter Medications as of 11/03/2022  Medication Sig   ibuprofen (ADVIL) 200 MG tablet Take 200 mg by mouth every 8 (eight) hours as needed.   levocetirizine (XYZAL) 5 MG tablet Take 5 mg by mouth every evening.   Multiple Vitamin (MULTIVITAMIN ADULT PO) Take by mouth.   Omega-3 Fatty Acids (FISH OIL) 1000 MG CAPS Take by mouth daily.   [DISCONTINUED] HYDROcodone-acetaminophen (NORCO/VICODIN) 5-325 MG tablet Take 1 tablet by mouth every 4 (four) hours as needed for moderate pain.   [DISCONTINUED] Tdap (BOOSTRIX) 5-2.5-18.5 LF-MCG/0.5 injection Inject 0.5 mLs into the muscle once.   [EXPIRED] Tdap (BOOSTRIX) 5-2.5-18.5 LF-MCG/0.5 injection Inject 0.5 mLs into the muscle once for 1 dose.   No facility-administered encounter medications on file as of 11/03/2022.    Surgical History: Past Surgical History:  Procedure Laterality Date   BREAST BIOPSY Right 2003   extra breast tissue in right breast under arm   DILATION AND CURETTAGE OF UTERUS     EXCISION OF BREAST LESION Left 01/27/2022   Procedure: EXCISION OF BREAST  LESION;  Surgeon: Earline Mayotte, MD;  Location: [facility] ORS;  Service: General;  Laterality: Left;   WISDOM TOOTH EXTRACTION  2000    Medical History: Past Medical History:  Diagnosis Date   Arthritis    Chicken pox    Fibroadenoma of breast, left    History of kidney stones    Pneumonia    Seasonal allergies     Family History: Family History  Problem Relation Age of Onset   Hypertension Mother    Diabetes Mother    COPD Mother    Arthritis Mother    Cancer Mother        colon   Hyperlipidemia Mother    Mental illness Mother    Hyperlipidemia Father    Stroke Father    Hypertension Father    Melanoma Father    Arthritis Maternal Aunt    Cancer Maternal Aunt        colon   Dementia Maternal Aunt    Depression Maternal Aunt    Arthritis Maternal Grandmother    Colon cancer Maternal Grandmother    Cancer Maternal Grandfather        lung   Diabetes Paternal Grandmother    Alcohol abuse Paternal Grandfather       Review of Systems  Constitutional:  Negative for chills, fatigue and unexpected weight change.  HENT:  Negative for congestion, postnasal drip, rhinorrhea, sneezing and sore throat.   Eyes:  Negative for redness.  Respiratory:  Negative for cough, chest  tightness and shortness of breath.   Cardiovascular:  Negative for chest pain and palpitations.  Gastrointestinal:  Negative for abdominal pain, constipation, diarrhea, nausea and vomiting.  Genitourinary:  Negative for dysuria and frequency.  Musculoskeletal:  Negative for arthralgias, back pain, joint swelling and neck pain.  Skin:  Negative for rash.  Neurological: Negative.  Negative for tremors and numbness.  Hematological:  Negative for adenopathy. Does not bruise/bleed easily.  Psychiatric/Behavioral:  Negative for behavioral problems (Depression), sleep disturbance and suicidal ideas. The patient is not nervous/anxious.      Vital Signs: BP 108/79   Pulse 86   Temp 97.8 F (36.6 C)    Resp 16   Ht 5\' 7"  (1.702 m)   Wt 174 lb (78.9 kg)   SpO2 96%   BMI 27.25 kg/m    Physical Exam Vitals and nursing note reviewed.  Constitutional:      General: She is not in acute distress.    Appearance: Normal appearance. She is well-developed. She is not diaphoretic.  HENT:     Head: Normocephalic and atraumatic.     Mouth/Throat:     Pharynx: No oropharyngeal exudate.  Eyes:     Pupils: Pupils are equal, round, and reactive to light.  Neck:     Thyroid: No thyromegaly.     Vascular: No JVD.     Trachea: No tracheal deviation.  Cardiovascular:     Rate and Rhythm: Normal rate and regular rhythm.     Heart sounds: Normal heart sounds. No murmur heard.    No friction rub. No gallop.  Pulmonary:     Effort: Pulmonary effort is normal. No respiratory distress.     Breath sounds: No wheezing or rales.  Chest:     Chest wall: No tenderness.  Abdominal:     General: Bowel sounds are normal.     Palpations: Abdomen is soft.     Tenderness: There is no abdominal tenderness.  Musculoskeletal:        General: Normal range of motion.     Cervical back: Normal range of motion and neck supple.  Lymphadenopathy:     Cervical: No cervical adenopathy.  Skin:    General: Skin is warm and dry.  Neurological:     Mental Status: She is alert and oriented to person, place, and time.     Cranial Nerves: No cranial nerve deficit.  Psychiatric:        Behavior: Behavior normal.        Thought Content: Thought content normal.        Judgment: Judgment normal.      LABS: Recent Results (from the past 2160 hour(s))  CBC w/Diff/Platelet     Status: Abnormal   Collection Time: 11/03/22 10:50 AM  Result Value Ref Range   WBC 7.9 3.4 - 10.8 x10E3/uL   RBC 4.76 3.77 - 5.28 x10E6/uL   Hemoglobin 14.0 11.1 - 15.9 g/dL   Hematocrit 40.9 81.1 - 46.6 %   MCV 88 79 - 97 fL   MCH 29.4 26.6 - 33.0 pg   MCHC 33.5 31.5 - 35.7 g/dL   RDW 91.4 78.2 - 95.6 %   Platelets 310 150 - 450  x10E3/uL   Neutrophils 53 Not Estab. %   Lymphs 32 Not Estab. %   Monocytes 7 Not Estab. %   Eos 7 Not Estab. %   Basos 1 Not Estab. %   Neutrophils Absolute 4.2 1.4 - 7.0 x10E3/uL   Lymphocytes Absolute  2.5 0.7 - 3.1 x10E3/uL   Monocytes Absolute 0.5 0.1 - 0.9 x10E3/uL   EOS (ABSOLUTE) 0.5 (H) 0.0 - 0.4 x10E3/uL   Basophils Absolute 0.1 0.0 - 0.2 x10E3/uL   Immature Granulocytes 0 Not Estab. %   Immature Grans (Abs) 0.0 0.0 - 0.1 x10E3/uL  Comprehensive metabolic panel     Status: Abnormal   Collection Time: 11/03/22 10:50 AM  Result Value Ref Range   Glucose 92 70 - 99 mg/dL   BUN 12 6 - 24 mg/dL   Creatinine, Ser 5.62 0.57 - 1.00 mg/dL   eGFR 93 >13 YQ/MVH/8.46   BUN/Creatinine Ratio 15 9 - 23   Sodium 139 134 - 144 mmol/L   Potassium 4.6 3.5 - 5.2 mmol/L   Chloride 103 96 - 106 mmol/L   CO2 24 20 - 29 mmol/L   Calcium 9.9 8.7 - 10.2 mg/dL   Total Protein 6.6 6.0 - 8.5 g/dL   Albumin 4.6 3.9 - 4.9 g/dL   Globulin, Total 2.0 1.5 - 4.5 g/dL   Bilirubin Total 0.5 0.0 - 1.2 mg/dL   Alkaline Phosphatase 55 44 - 121 IU/L   AST 25 0 - 40 IU/L   ALT 37 (H) 0 - 32 IU/L  TSH + free T4     Status: None   Collection Time: 11/03/22 10:50 AM  Result Value Ref Range   TSH 2.230 0.450 - 4.500 uIU/mL   Free T4 1.17 0.82 - 1.77 ng/dL  Lipid Panel With LDL/HDL Ratio     Status: Abnormal   Collection Time: 11/03/22 10:50 AM  Result Value Ref Range   Cholesterol, Total 222 (H) 100 - 199 mg/dL   Triglycerides 962 (H) 0 - 149 mg/dL   HDL 59 >95 mg/dL   VLDL Cholesterol Cal 33 5 - 40 mg/dL   LDL Chol Calc (NIH) 284 (H) 0 - 99 mg/dL   LDL/HDL Ratio 2.2 0.0 - 3.2 ratio    Comment:                                     LDL/HDL Ratio                                             Men  Women                               1/2 Avg.Risk  1.0    1.5                                   Avg.Risk  3.6    3.2                                2X Avg.Risk  6.2    5.0                                3X Avg.Risk   8.0    6.1   UA/M w/rflx Culture, Routine     Status: Abnormal   Collection Time: 11/03/22 12:52 PM  Specimen: Urine   Urine  Result Value Ref Range   Specific Gravity, UA 1.021 1.005 - 1.030   pH, UA 5.5 5.0 - 7.5   Color, UA Yellow Yellow   Appearance Ur Turbid (A) Clear   Leukocytes,UA Negative Negative   Protein,UA Negative Negative/Trace   Glucose, UA Negative Negative   Ketones, UA Negative Negative   RBC, UA Negative Negative   Bilirubin, UA Negative Negative   Urobilinogen, Ur 0.2 0.2 - 1.0 mg/dL   Nitrite, UA Negative Negative   Microscopic Examination Comment     Comment: Microscopic follows if indicated.   Microscopic Examination See below:     Comment: Microscopic was indicated and was performed.   Urinalysis Reflex Comment     Comment: This specimen will not reflex to a Urine Culture.  Microscopic Examination     Status: None   Collection Time: 11/03/22 12:52 PM   Urine  Result Value Ref Range   WBC, UA 0-5 0 - 5 /hpf   RBC, Urine None seen 0 - 2 /hpf   Epithelial Cells (non renal) 0-10 0 - 10 /hpf   Casts None seen None seen /lpf   Bacteria, UA None seen None seen/Few        Assessment/Plan: 1. Encounter for general adult medical examination with abnormal findings CPE performed, labs ordered and will update tdap  2. Visit for screening mammogram - MM 3D SCREENING MAMMOGRAM BILATERAL BREAST; Future  3. Mixed hyperlipidemia - Lipid Panel With LDL/HDL Ratio  4. Abnormal thyroid blood test - TSH + free T4  5. Other fatigue - CBC w/Diff/Platelet - Comprehensive metabolic panel - TSH + free T4 - Lipid Panel With LDL/HDL Ratio  6. Dysuria - UA/M w/rflx Culture, Routine - Microscopic Examination   General Counseling: Contessa verbalizes understanding of the findings of todays visit and agrees with plan of treatment. I have discussed any further diagnostic evaluation that may be needed or ordered today. We also reviewed her medications today. she  has been encouraged to call the office with any questions or concerns that should arise related to todays visit.    Counseling:    Orders Placed This Encounter  Procedures   Microscopic Examination   MM 3D SCREENING MAMMOGRAM BILATERAL BREAST   UA/M w/rflx Culture, Routine   CBC w/Diff/Platelet   Comprehensive metabolic panel   TSH + free T4   Lipid Panel With LDL/HDL Ratio    Meds ordered this encounter  Medications   Tdap (BOOSTRIX) 5-2.5-18.5 LF-MCG/0.5 injection    Sig: Inject 0.5 mLs into the muscle once for 1 dose.    Dispense:  0.5 mL    Refill:  0    This patient was seen by Lynn Ito, PA-C in collaboration with Dr. Beverely Risen as a part of collaborative care agreement.  Total time spent:35 Minutes  Time spent includes review of chart, medications, test results, and follow up plan with the patient.     Lyndon Code, MD  Internal Medicine

## 2022-11-09 ENCOUNTER — Encounter: Payer: Self-pay | Admitting: Physician Assistant

## 2022-12-06 ENCOUNTER — Ambulatory Visit
Admission: RE | Admit: 2022-12-06 | Discharge: 2022-12-06 | Disposition: A | Discharge status: Home / Self Care | Payer: BC Managed Care – PPO | Source: Ambulatory Visit | Attending: Physician Assistant | Admitting: Physician Assistant

## 2022-12-06 DIAGNOSIS — Z1231 Encounter for screening mammogram for malignant neoplasm of breast: Secondary | ICD-10-CM | POA: Insufficient documentation

## 2023-11-08 ENCOUNTER — Encounter: Payer: Self-pay | Admitting: Physician Assistant

## 2023-11-08 ENCOUNTER — Ambulatory Visit: Payer: Self-pay | Admitting: Physician Assistant

## 2023-11-08 VITALS — BP 115/60 | HR 79 | Temp 97.9°F | Resp 16 | Ht 67.0 in | Wt 174.0 lb

## 2023-11-08 DIAGNOSIS — R2231 Localized swelling, mass and lump, right upper limb: Secondary | ICD-10-CM

## 2023-11-08 DIAGNOSIS — Z1211 Encounter for screening for malignant neoplasm of colon: Secondary | ICD-10-CM | POA: Diagnosis not present

## 2023-11-08 DIAGNOSIS — Z1231 Encounter for screening mammogram for malignant neoplasm of breast: Secondary | ICD-10-CM | POA: Diagnosis not present

## 2023-11-08 DIAGNOSIS — Z0001 Encounter for general adult medical examination with abnormal findings: Secondary | ICD-10-CM

## 2023-11-08 DIAGNOSIS — Z124 Encounter for screening for malignant neoplasm of cervix: Secondary | ICD-10-CM

## 2023-11-08 DIAGNOSIS — Z1212 Encounter for screening for malignant neoplasm of rectum: Secondary | ICD-10-CM

## 2023-11-08 NOTE — Progress Notes (Signed)
 [facility] [address] [place], [place] [address]  Internal MEDICINE  Office Visit Note  Patient Name: Jodi Wright  [id number]  [id number]  Date of Service: 11/19/2023  Chief Complaint  Patient presents with   Annual Exam     HPI Pt is here for routine health maintenance examination -due for colonoscopy; great Aunt and possibly grandmother had colon CA -got a cold end of school year-congestion, cough, and got over it but came back about 2 weeks later and then went away again before coming back a final time with lingering cough. Had an inhaler on hand from previous bronchitis and it helped.  -otherwise doing well -due for pap and will have this done today -also due for mammogram and will add US  due to fullness anterior of along right axilla. States she had breast tissue removed from here before due to extra fullness that stick out from bra and seems to be coming back again. No tenderness or defined masses detected. -due for labs-slip given  Current Medication: Outpatient Encounter Medications as of 11/08/2023  Medication Sig   ibuprofen (ADVIL) 200 MG tablet Take 200 mg by mouth every 8 (eight) hours as needed.   levocetirizine (XYZAL) 5 MG tablet Take 5 mg by mouth every evening.   Multiple Vitamin (MULTIVITAMIN ADULT PO) Take by mouth.   Omega-3 Fatty Acids (FISH OIL) 1000 MG CAPS Take by mouth daily.   No facility-administered encounter medications on file as of 11/08/2023.    Surgical History: Past Surgical History:  Procedure Laterality Date   BREAST BIOPSY Right 2003   extra breast tissue in right breast under arm   BREAST EXCISIONAL BIOPSY     DILATION AND CURETTAGE OF UTERUS     EXCISION OF BREAST LESION Left 01/27/2022   Procedure: EXCISION OF BREAST LESION;  Surgeon: Dessa Reyes ORN, MD;  Location: [facility] ORS;  Service: General;  Laterality: Left;   WISDOM TOOTH EXTRACTION  2000    Medical History: Past Medical History:  Diagnosis Date    Arthritis    Chicken pox    Fibroadenoma of breast, left    History of kidney stones    Pneumonia    Seasonal allergies     Family History: Family History  Problem Relation Age of Onset   Hypertension Mother    Diabetes Mother    COPD Mother    Arthritis Mother    Cancer Mother        colon   Hyperlipidemia Mother    Mental illness Mother    Hyperlipidemia Father    Stroke Father    Hypertension Father    Melanoma Father    Arthritis Maternal Aunt    Cancer Maternal Aunt        colon   Dementia Maternal Aunt    Depression Maternal Aunt    Arthritis Maternal Grandmother    Colon cancer Maternal Grandmother    Cancer Maternal Grandfather        lung   Diabetes Paternal Grandmother    Alcohol abuse Paternal Grandfather       Review of Systems  Constitutional:  Negative for chills, fatigue and unexpected weight change.  HENT:  Negative for congestion, postnasal drip, rhinorrhea, sneezing and sore throat.   Eyes:  Negative for redness.  Respiratory:  Negative for cough, chest tightness and shortness of breath.   Cardiovascular:  Negative for chest pain and palpitations.  Gastrointestinal:  Negative for abdominal pain, constipation, diarrhea, nausea and vomiting.  Genitourinary:  Negative for dysuria and frequency.  Musculoskeletal:  Negative for arthralgias, back pain, joint swelling and neck pain.  Skin:  Negative for rash.  Neurological: Negative.  Negative for tremors and numbness.  Hematological:  Negative for adenopathy. Does not bruise/bleed easily.  Psychiatric/Behavioral:  Negative for behavioral problems (Depression), sleep disturbance and suicidal ideas. The patient is not nervous/anxious.      Vital Signs: BP 115/60   Pulse 79   Temp 97.9 F (36.6 C)   Resp 16   Ht 5' 7 (1.702 m)   Wt 174 lb (78.9 kg)   SpO2 99%   BMI 27.25 kg/m    Physical Exam Vitals and nursing note reviewed.  Constitutional:      General: She is not in acute distress.     Appearance: Normal appearance. She is well-developed. She is not diaphoretic.  HENT:     Head: Normocephalic and atraumatic.     Mouth/Throat:     Pharynx: No oropharyngeal exudate.  Eyes:     Pupils: Pupils are equal, round, and reactive to light.  Neck:     Thyroid : No thyromegaly.     Vascular: No JVD.     Trachea: No tracheal deviation.  Cardiovascular:     Rate and Rhythm: Normal rate and regular rhythm.     Heart sounds: Normal heart sounds. No murmur heard.    No friction rub. No gallop.  Pulmonary:     Effort: Pulmonary effort is normal. No respiratory distress.     Breath sounds: No wheezing or rales.  Chest:     Chest wall: No tenderness.  Breasts:    Right: Normal. No mass.     Left: Normal. No mass.    Abdominal:     General: Bowel sounds are normal.     Palpations: Abdomen is soft.     Tenderness: There is no abdominal tenderness.  Genitourinary:    Exam position: Lithotomy position.     Vagina: No vaginal discharge.     Comments: Pap performed Musculoskeletal:        General: Normal range of motion.     Cervical back: Normal range of motion and neck supple.  Lymphadenopathy:     Cervical: No cervical adenopathy.  Skin:    General: Skin is warm and dry.  Neurological:     Mental Status: She is alert and oriented to person, place, and time.     Cranial Nerves: No cranial nerve deficit.  Psychiatric:        Behavior: Behavior normal.        Thought Content: Thought content normal.        Judgment: Judgment normal.      LABS: Recent Results (from the past 2160 hours)  IGP, Aptima HPV     Status: None   Collection Time: 11/08/23  1:41 PM  Result Value Ref Range   Interpretation NILM     Comment: NEGATIVE FOR INTRAEPITHELIAL LESION OR MALIGNANCY.   Category NIL     Comment: Negative for Intraepithelial Lesion   Adequacy ENDO     Comment: Satisfactory for evaluation. Endocervical and/or squamous metaplastic cells (endocervical component) are  present.    Clinician Provided ICD10 Comment     Comment: Z12.4   Performed by: Comment     Comment: Rilla Cassis, BS, Cytologist (ASCP)   Note: Comment     Comment: The Pap smear is a screening test designed to aid in the detection of premalignant and malignant conditions of the  uterine cervix.  It is not a diagnostic procedure and should not be used as the sole means of detecting cervical cancer.  Both false-positive and false-negative reports do occur.    Test Methodology Comment     Comment: This liquid based ThinPrep(R) pap test was interpreted using the Hologic(R) Genius(TM) Cervical Algorithm whole slide imaging system.    HPV Aptima Negative Negative    Comment: This nucleic acid amplification test detects fourteen high-risk HPV types (16,18,31,33,35,39,45,51,52,56,58,59,66,68) without differentiation.   CBC with Differential/Platelet     Status: Abnormal   Collection Time: 11/09/23  9:31 AM  Result Value Ref Range   WBC 6.9 3.4 - 10.8 x10E3/uL   RBC 4.78 3.77 - 5.28 x10E6/uL   Hemoglobin 14.0 11.1 - 15.9 g/dL   Hematocrit 56.6 65.9 - 46.6 %   MCV 91 79 - 97 fL   MCH 29.3 26.6 - 33.0 pg   MCHC 32.3 31.5 - 35.7 g/dL   RDW 87.5 88.2 - 84.5 %   Platelets 307 150 - 450 x10E3/uL   Neutrophils 51 Not Estab. %   Lymphs 33 Not Estab. %   Monocytes 8 Not Estab. %   Eos 7 Not Estab. %   Basos 1 Not Estab. %   Neutrophils Absolute 3.5 1.4 - 7.0 x10E3/uL   Lymphocytes Absolute 2.3 0.7 - 3.1 x10E3/uL   Monocytes Absolute 0.5 0.1 - 0.9 x10E3/uL   EOS (ABSOLUTE) 0.5 (H) 0.0 - 0.4 x10E3/uL   Basophils Absolute 0.1 0.0 - 0.2 x10E3/uL   Immature Granulocytes 0 Not Estab. %   Immature Grans (Abs) 0.0 0.0 - 0.1 x10E3/uL  Comprehensive metabolic panel with GFR     Status: None   Collection Time: 11/09/23  9:31 AM  Result Value Ref Range   Glucose 89 70 - 99 mg/dL   BUN 11 6 - 24 mg/dL   Creatinine, Ser 9.06 0.57 - 1.00 mg/dL   eGFR 77 >40 fO/fpw/8.26   BUN/Creatinine Ratio 12 9 -  23   Sodium 138 134 - 144 mmol/L   Potassium 4.6 3.5 - 5.2 mmol/L   Chloride 103 96 - 106 mmol/L   CO2 21 20 - 29 mmol/L   Calcium 9.9 8.7 - 10.2 mg/dL   Total Protein 6.7 6.0 - 8.5 g/dL   Albumin 4.5 3.9 - 4.9 g/dL   Globulin, Total 2.2 1.5 - 4.5 g/dL   Bilirubin Total 0.6 0.0 - 1.2 mg/dL   Alkaline Phosphatase 54 44 - 121 IU/L   AST 16 0 - 40 IU/L   ALT 20 0 - 32 IU/L  Lipid Panel With LDL/HDL Ratio     Status: Abnormal   Collection Time: 11/09/23  9:31 AM  Result Value Ref Range   Cholesterol, Total 215 (H) 100 - 199 mg/dL   Triglycerides 92 0 - 149 mg/dL   HDL 61 >60 mg/dL   VLDL Cholesterol Cal 16 5 - 40 mg/dL   LDL Chol Calc (NIH) 861 (H) 0 - 99 mg/dL   LDL/HDL Ratio 2.3 0.0 - 3.2 ratio    Comment:                                     LDL/HDL Ratio  Men  Women                               1/2 Avg.Risk  1.0    1.5                                   Avg.Risk  3.6    3.2                                2X Avg.Risk  6.2    5.0                                3X Avg.Risk  8.0    6.1   Iron and TIBC     Status: None   Collection Time: 11/09/23  9:31 AM  Result Value Ref Range   Total Iron Binding Capacity 277 250 - 450 ug/dL   UIBC 821 868 - 574 ug/dL   Iron 99 27 - 840 ug/dL   Iron Saturation 36 15 - 55 %  B12 and Folate Panel     Status: None   Collection Time: 11/09/23  9:31 AM  Result Value Ref Range   Vitamin B-12 629 232 - 1,245 pg/mL   Folate >20.0 >3.0 ng/mL    Comment: A serum folate concentration of less than 3.1 ng/mL is considered to represent clinical deficiency.   Hgb A1c w/o eAG     Status: None   Collection Time: 11/09/23  9:31 AM  Result Value Ref Range   Hgb A1c MFr Bld 5.1 4.8 - 5.6 %    Comment:          Prediabetes: 5.7 - 6.4          Diabetes: >6.4          Glycemic control for adults with diabetes: <7.0   T4, free     Status: None   Collection Time: 11/09/23  9:31 AM  Result Value Ref Range   Free T4  1.21 0.82 - 1.77 ng/dL  TSH     Status: None   Collection Time: 11/09/23  9:31 AM  Result Value Ref Range   TSH 2.090 0.450 - 4.500 uIU/mL  VITAMIN D 25 Hydroxy (Vit-D Deficiency, Fractures)     Status: None   Collection Time: 11/09/23  9:31 AM  Result Value Ref Range   Vit D, 25-Hydroxy 39.4 30.0 - 100.0 ng/mL    Comment: Vitamin D deficiency has been defined by the Institute of Medicine and an Endocrine Society practice guideline as a level of serum 25-OH vitamin D less than 20 ng/mL (1,2). The Endocrine Society went on to further define vitamin D insufficiency as a level between 21 and 29 ng/mL (2). 1. IOM (Institute of Medicine). 2010. Dietary reference    intakes for calcium and D. [place]: The    [employer]. 2. Holick MF, Binkley Bystrom, Bischoff-Ferrari HA, et al.    Evaluation, treatment, and prevention of vitamin D    deficiency: an Endocrine Society clinical practice    guideline. JCEM. 2011 Jul; 96(7):1911-30.   Ferritin     Status: None   Collection Time: 11/09/23  9:31 AM  Result Value Ref Range   Ferritin 36 15 -  150 ng/mL        Assessment/Plan: 1. Encounter for general adult medical examination with abnormal findings (Primary) CPE performed, lab slip given, due for pap and mammogram and colon screening  2. Routine cervical smear - IGP, Aptima HPV  3. Screening for colorectal cancer - Ambulatory referral to Gastroenterology  4. Visit for screening mammogram - MM 3D SCREENING MAMMOGRAM BILATERAL BREAST; Future  5. Right axillary fullness Will order US  to evaluate further, pt has hx of surgery to remove excess breast tissue in the same area - US  LIMITED ULTRASOUND INCLUDING AXILLA RIGHT BREAST; Future   General Counseling: Maat verbalizes understanding of the findings of todays visit and agrees with plan of treatment. I have discussed any further diagnostic evaluation that may be needed or ordered today. We also reviewed her  medications today. she has been encouraged to call the office with any questions or concerns that should arise related to todays visit.    Counseling:    Orders Placed This Encounter  Procedures   MM 3D SCREENING MAMMOGRAM BILATERAL BREAST   US  LIMITED ULTRASOUND INCLUDING AXILLA RIGHT BREAST   Ambulatory referral to Gastroenterology    No orders of the defined types were placed in this encounter.   This patient was seen by Tinnie Pro, PA-C in collaboration with Dr. Sigrid Bathe as a part of collaborative care agreement.  Total time spent:35 Minutes  Time spent includes review of chart, medications, test results, and follow up plan with the patient.     Sigrid CHRISTELLA Bathe, MD  Internal Medicine

## 2023-11-09 ENCOUNTER — Other Ambulatory Visit: Payer: Self-pay | Admitting: Physician Assistant

## 2023-11-10 LAB — VITAMIN D 25 HYDROXY (VIT D DEFICIENCY, FRACTURES): Vit D, 25-Hydroxy: 39.4 ng/mL (ref 30.0–100.0)

## 2023-11-10 LAB — CBC WITH DIFFERENTIAL/PLATELET
Basophils Absolute: 0.1 x10E3/uL (ref 0.0–0.2)
Basos: 1 %
EOS (ABSOLUTE): 0.5 x10E3/uL — ABNORMAL HIGH (ref 0.0–0.4)
Eos: 7 %
Hematocrit: 43.3 % (ref 34.0–46.6)
Hemoglobin: 14 g/dL (ref 11.1–15.9)
Immature Grans (Abs): 0 x10E3/uL (ref 0.0–0.1)
Immature Granulocytes: 0 %
Lymphocytes Absolute: 2.3 x10E3/uL (ref 0.7–3.1)
Lymphs: 33 %
MCH: 29.3 pg (ref 26.6–33.0)
MCHC: 32.3 g/dL (ref 31.5–35.7)
MCV: 91 fL (ref 79–97)
Monocytes Absolute: 0.5 x10E3/uL (ref 0.1–0.9)
Monocytes: 8 %
Neutrophils Absolute: 3.5 x10E3/uL (ref 1.4–7.0)
Neutrophils: 51 %
Platelets: 307 x10E3/uL (ref 150–450)
RBC: 4.78 x10E6/uL (ref 3.77–5.28)
RDW: 12.4 % (ref 11.7–15.4)
WBC: 6.9 x10E3/uL (ref 3.4–10.8)

## 2023-11-10 LAB — LIPID PANEL WITH LDL/HDL RATIO
Cholesterol, Total: 215 mg/dL — ABNORMAL HIGH (ref 100–199)
HDL: 61 mg/dL
LDL Chol Calc (NIH): 138 mg/dL — ABNORMAL HIGH (ref 0–99)
LDL/HDL Ratio: 2.3 ratio (ref 0.0–3.2)
Triglycerides: 92 mg/dL (ref 0–149)
VLDL Cholesterol Cal: 16 mg/dL (ref 5–40)

## 2023-11-10 LAB — COMPREHENSIVE METABOLIC PANEL WITH GFR
ALT: 20 IU/L (ref 0–32)
AST: 16 IU/L (ref 0–40)
Albumin: 4.5 g/dL (ref 3.9–4.9)
Alkaline Phosphatase: 54 IU/L (ref 44–121)
BUN/Creatinine Ratio: 12 (ref 9–23)
BUN: 11 mg/dL (ref 6–24)
Bilirubin Total: 0.6 mg/dL (ref 0.0–1.2)
CO2: 21 mmol/L (ref 20–29)
Calcium: 9.9 mg/dL (ref 8.7–10.2)
Chloride: 103 mmol/L (ref 96–106)
Creatinine, Ser: 0.93 mg/dL (ref 0.57–1.00)
Globulin, Total: 2.2 g/dL (ref 1.5–4.5)
Glucose: 89 mg/dL (ref 70–99)
Potassium: 4.6 mmol/L (ref 3.5–5.2)
Sodium: 138 mmol/L (ref 134–144)
Total Protein: 6.7 g/dL (ref 6.0–8.5)
eGFR: 77 mL/min/1.73 (ref >59)

## 2023-11-10 LAB — T4, FREE: Free T4: 1.21 ng/dL (ref 0.82–1.77)

## 2023-11-10 LAB — IRON AND TIBC
Iron Saturation: 36 % (ref 15–55)
Iron: 99 ug/dL (ref 27–159)
Total Iron Binding Capacity: 277 ug/dL (ref 250–450)
UIBC: 178 ug/dL (ref 131–425)

## 2023-11-10 LAB — B12 AND FOLATE PANEL
Folate: >20 ng/mL (ref >3.0)
Vitamin B-12: 629 pg/mL (ref 232–1245)

## 2023-11-10 LAB — HGB A1C W/O EAG: Hgb A1c MFr Bld: 5.1 % (ref 4.8–5.6)

## 2023-11-10 LAB — TSH: TSH: 2.09 u[IU]/mL (ref 0.450–4.500)

## 2023-11-10 LAB — FERRITIN: Ferritin: 36 ng/mL (ref 15–150)

## 2023-11-11 LAB — IGP, APTIMA HPV: HPV Aptima: NEGATIVE

## 2023-11-12 ENCOUNTER — Ambulatory Visit: Payer: Self-pay | Admitting: Physician Assistant

## 2023-11-12 ENCOUNTER — Telehealth: Payer: Self-pay | Admitting: Physician Assistant

## 2023-11-12 NOTE — Telephone Encounter (Signed)
 Awaiting 11/08/23 office notes for Gastroenterology referral-Toni

## 2023-11-14 NOTE — Telephone Encounter (Signed)
-----   Message from Tinnie MARLA Pro sent at 11/12/2023  1:29 PM EDT ----- Please let her know that her pap was normal and HPV negative. Cholesterol is about the same, a little elevated and advise she continue to work on this by reducing fried foods and increasing exercise.  Other wise labs looked good ----- Message ----- From: Interface, [facility] Lab Results In Sent: 11/12/2023   6:10 AM EDT To: Tinnie MARLA Pro, PA-C

## 2023-11-14 NOTE — Telephone Encounter (Signed)
 Pt notified for pap smear result and labs

## 2023-11-19 ENCOUNTER — Telehealth: Payer: Self-pay | Admitting: Physician Assistant

## 2023-11-19 NOTE — Telephone Encounter (Signed)
 Gastroenterology referral sent via [facility] to [facility]. Lvm notifying patient. Gave telephone # [phone]

## 2024-01-02 ENCOUNTER — Telehealth: Payer: Self-pay | Admitting: Physician Assistant

## 2024-01-02 NOTE — Telephone Encounter (Signed)
 Per Delon w/ [facility], referral has been closed due to patient not returning calls -Andree

## 2024-11-10 ENCOUNTER — Encounter: Admitting: Physician Assistant
